# Patient Record
Sex: Female | Born: 1955
Health system: Southern US, Community
[De-identification: ages and names within clinical notes are randomized; demographics above are authoritative.]

## PROBLEM LIST (undated history)

## (undated) DIAGNOSIS — N390 Urinary tract infection, site not specified: Secondary | ICD-10-CM

## (undated) DIAGNOSIS — Z87898 Personal history of other specified conditions: Secondary | ICD-10-CM

## (undated) DIAGNOSIS — M1990 Unspecified osteoarthritis, unspecified site: Secondary | ICD-10-CM

## (undated) DIAGNOSIS — R51 Headache: Secondary | ICD-10-CM

## (undated) DIAGNOSIS — N63 Unspecified lump in unspecified breast: Secondary | ICD-10-CM

## (undated) DIAGNOSIS — E559 Vitamin D deficiency, unspecified: Secondary | ICD-10-CM

## (undated) DIAGNOSIS — F438 Other reactions to severe stress: Secondary | ICD-10-CM

## (undated) DIAGNOSIS — K219 Gastro-esophageal reflux disease without esophagitis: Secondary | ICD-10-CM

## (undated) DIAGNOSIS — M8580 Other specified disorders of bone density and structure, unspecified site: Secondary | ICD-10-CM

## (undated) DIAGNOSIS — C801 Malignant (primary) neoplasm, unspecified: Secondary | ICD-10-CM

## (undated) DIAGNOSIS — T7840XA Allergy, unspecified, initial encounter: Secondary | ICD-10-CM

## (undated) DIAGNOSIS — R05 Cough: Secondary | ICD-10-CM

## (undated) DIAGNOSIS — N6009 Solitary cyst of unspecified breast: Secondary | ICD-10-CM

## (undated) HISTORY — PX: UPPER GASTROINTESTINAL ENDOSCOPY: SHX188

## (undated) HISTORY — DX: Vitamin D deficiency, unspecified: E55.9

## (undated) HISTORY — PX: COLONOSCOPY: SHX174

## (undated) HISTORY — DX: Other reactions to severe stress: F43.8

## (undated) HISTORY — DX: Solitary cyst of unspecified breast: N60.09

## (undated) HISTORY — PX: BREAST CYST ASPIRATION: SHX578

## (undated) HISTORY — DX: Unspecified osteoarthritis, unspecified site: M19.90

## (undated) HISTORY — DX: Headache: R51

## (undated) HISTORY — DX: Cough: R05

## (undated) HISTORY — DX: Allergy, unspecified, initial encounter: T78.40XA

## (undated) HISTORY — DX: Unspecified lump in unspecified breast: N63.0

## (undated) HISTORY — DX: Other specified disorders of bone density and structure, unspecified site: M85.80

## (undated) HISTORY — PX: DILATION AND CURETTAGE OF UTERUS: SHX78

## (undated) HISTORY — DX: Personal history of other specified conditions: Z87.898

## (undated) HISTORY — PX: TUBAL LIGATION: SHX77

## (undated) HISTORY — DX: Urinary tract infection, site not specified: N39.0

## (undated) HISTORY — PX: BREAST ENHANCEMENT SURGERY: SHX7

## (undated) HISTORY — DX: Gastro-esophageal reflux disease without esophagitis: K21.9

## (undated) HISTORY — DX: Malignant (primary) neoplasm, unspecified: C80.1

## (undated) HISTORY — PX: TONSILLECTOMY: SUR1361

---

## 1998-08-21 ENCOUNTER — Other Ambulatory Visit: Admission: RE | Admit: 1998-08-21 | Discharge: 1998-08-21 | Payer: Self-pay | Admitting: Internal Medicine

## 1999-09-04 ENCOUNTER — Other Ambulatory Visit: Admission: RE | Admit: 1999-09-04 | Discharge: 1999-09-04 | Payer: Self-pay | Admitting: Internal Medicine

## 2001-02-02 ENCOUNTER — Other Ambulatory Visit: Admission: RE | Admit: 2001-02-02 | Discharge: 2001-02-02 | Payer: Self-pay | Admitting: Internal Medicine

## 2001-02-02 ENCOUNTER — Encounter: Payer: Self-pay | Admitting: Internal Medicine

## 2002-03-05 ENCOUNTER — Other Ambulatory Visit: Admission: RE | Admit: 2002-03-05 | Discharge: 2002-03-05 | Payer: Self-pay | Admitting: Internal Medicine

## 2003-08-05 ENCOUNTER — Other Ambulatory Visit: Admission: RE | Admit: 2003-08-05 | Discharge: 2003-08-05 | Payer: Self-pay | Admitting: Internal Medicine

## 2003-08-21 DIAGNOSIS — Z860100 Personal history of colon polyps, unspecified: Secondary | ICD-10-CM | POA: Insufficient documentation

## 2004-04-30 ENCOUNTER — Ambulatory Visit: Payer: Self-pay | Admitting: Internal Medicine

## 2004-06-05 ENCOUNTER — Ambulatory Visit: Payer: Self-pay | Admitting: Internal Medicine

## 2004-06-14 DIAGNOSIS — K219 Gastro-esophageal reflux disease without esophagitis: Secondary | ICD-10-CM

## 2004-06-14 HISTORY — DX: Gastro-esophageal reflux disease without esophagitis: K21.9

## 2004-09-14 ENCOUNTER — Other Ambulatory Visit: Admission: RE | Admit: 2004-09-14 | Discharge: 2004-09-14 | Payer: Self-pay | Admitting: Internal Medicine

## 2004-09-14 ENCOUNTER — Ambulatory Visit: Payer: Self-pay | Admitting: Internal Medicine

## 2005-05-03 ENCOUNTER — Inpatient Hospital Stay (HOSPITAL_COMMUNITY): Admission: EM | Admit: 2005-05-03 | Discharge: 2005-05-04 | Payer: Self-pay | Admitting: Emergency Medicine

## 2005-05-03 ENCOUNTER — Ambulatory Visit: Payer: Self-pay | Admitting: Internal Medicine

## 2005-05-04 ENCOUNTER — Ambulatory Visit: Payer: Self-pay | Admitting: Internal Medicine

## 2005-05-14 ENCOUNTER — Ambulatory Visit: Payer: Self-pay | Admitting: Internal Medicine

## 2005-07-16 ENCOUNTER — Ambulatory Visit: Payer: Self-pay | Admitting: Internal Medicine

## 2005-10-11 ENCOUNTER — Ambulatory Visit: Payer: Self-pay | Admitting: Internal Medicine

## 2005-10-11 ENCOUNTER — Other Ambulatory Visit: Admission: RE | Admit: 2005-10-11 | Discharge: 2005-10-11 | Payer: Self-pay | Admitting: Internal Medicine

## 2005-10-11 ENCOUNTER — Encounter: Payer: Self-pay | Admitting: Internal Medicine

## 2005-10-25 ENCOUNTER — Ambulatory Visit: Payer: Self-pay | Admitting: Internal Medicine

## 2006-03-03 ENCOUNTER — Ambulatory Visit: Payer: Self-pay | Admitting: Internal Medicine

## 2006-03-22 ENCOUNTER — Ambulatory Visit: Payer: Self-pay | Admitting: Internal Medicine

## 2006-05-09 ENCOUNTER — Ambulatory Visit: Payer: Self-pay | Admitting: Internal Medicine

## 2006-06-14 HISTORY — PX: PHOTOREFRACTIVE KERATOTOMY: SHX216

## 2006-09-12 ENCOUNTER — Ambulatory Visit: Payer: Self-pay | Admitting: Internal Medicine

## 2006-09-19 ENCOUNTER — Ambulatory Visit: Payer: Self-pay | Admitting: Internal Medicine

## 2007-01-25 ENCOUNTER — Telehealth: Payer: Self-pay | Admitting: Internal Medicine

## 2007-02-16 DIAGNOSIS — K219 Gastro-esophageal reflux disease without esophagitis: Secondary | ICD-10-CM | POA: Insufficient documentation

## 2007-03-07 ENCOUNTER — Ambulatory Visit: Payer: Self-pay | Admitting: Internal Medicine

## 2007-03-07 DIAGNOSIS — R5383 Other fatigue: Secondary | ICD-10-CM

## 2007-03-07 DIAGNOSIS — R5381 Other malaise: Secondary | ICD-10-CM | POA: Insufficient documentation

## 2007-03-07 DIAGNOSIS — G4700 Insomnia, unspecified: Secondary | ICD-10-CM | POA: Insufficient documentation

## 2007-03-09 LAB — CONVERTED CEMR LAB
Free T4: 0.8 ng/dL (ref 0.6–1.6)
T3, Free: 2.6 pg/mL (ref 2.3–4.2)
TSH: 1.17 microintl units/mL (ref 0.35–5.50)

## 2007-03-14 ENCOUNTER — Telehealth: Payer: Self-pay | Admitting: Internal Medicine

## 2007-03-14 LAB — CONVERTED CEMR LAB: Vit D, 1,25-Dihydroxy: 20 (ref 20–57)

## 2007-04-06 ENCOUNTER — Ambulatory Visit: Payer: Self-pay | Admitting: Internal Medicine

## 2007-04-25 ENCOUNTER — Telehealth: Payer: Self-pay | Admitting: Internal Medicine

## 2007-05-16 ENCOUNTER — Ambulatory Visit: Payer: Self-pay | Admitting: Internal Medicine

## 2007-05-16 ENCOUNTER — Encounter: Payer: Self-pay | Admitting: Internal Medicine

## 2007-05-16 ENCOUNTER — Other Ambulatory Visit: Admission: RE | Admit: 2007-05-16 | Discharge: 2007-05-16 | Payer: Self-pay | Admitting: Internal Medicine

## 2007-07-31 ENCOUNTER — Ambulatory Visit: Payer: Self-pay | Admitting: Internal Medicine

## 2007-07-31 ENCOUNTER — Telehealth: Payer: Self-pay | Admitting: Internal Medicine

## 2007-07-31 ENCOUNTER — Ambulatory Visit: Payer: Self-pay

## 2007-07-31 DIAGNOSIS — R609 Edema, unspecified: Secondary | ICD-10-CM

## 2007-07-31 DIAGNOSIS — R0609 Other forms of dyspnea: Secondary | ICD-10-CM

## 2007-07-31 DIAGNOSIS — M79609 Pain in unspecified limb: Secondary | ICD-10-CM

## 2007-07-31 DIAGNOSIS — R0989 Other specified symptoms and signs involving the circulatory and respiratory systems: Secondary | ICD-10-CM | POA: Insufficient documentation

## 2007-08-30 ENCOUNTER — Telehealth: Payer: Self-pay | Admitting: Internal Medicine

## 2007-09-21 ENCOUNTER — Encounter: Payer: Self-pay | Admitting: Internal Medicine

## 2007-10-13 ENCOUNTER — Telehealth: Payer: Self-pay | Admitting: Internal Medicine

## 2007-10-16 ENCOUNTER — Ambulatory Visit: Payer: Self-pay | Admitting: Internal Medicine

## 2007-10-16 DIAGNOSIS — R42 Dizziness and giddiness: Secondary | ICD-10-CM

## 2007-11-13 ENCOUNTER — Ambulatory Visit: Payer: Self-pay | Admitting: Internal Medicine

## 2007-11-13 DIAGNOSIS — E559 Vitamin D deficiency, unspecified: Secondary | ICD-10-CM

## 2007-11-13 DIAGNOSIS — L659 Nonscarring hair loss, unspecified: Secondary | ICD-10-CM | POA: Insufficient documentation

## 2007-11-13 HISTORY — DX: Vitamin D deficiency, unspecified: E55.9

## 2007-11-13 LAB — CONVERTED CEMR LAB: Vit D, 1,25-Dihydroxy: 45 (ref 30–89)

## 2007-11-19 LAB — CONVERTED CEMR LAB
ALT: 18 U/L
AST: 20 U/L
Albumin: 3.9 g/dL
Alkaline Phosphatase: 44 U/L
BUN: 11 mg/dL
Basophils Absolute: 0 K/uL
Basophils Relative: 0.5 %
Bilirubin, Direct: 0.1 mg/dL
CO2: 25 meq/L
Calcium: 9.4 mg/dL
Chloride: 102 meq/L
Creatinine, Ser: 0.8 mg/dL
Eosinophils Absolute: 0.1 K/uL
Eosinophils Relative: 1.3 %
Ferritin: 28 ng/mL
Free T4: 0.9 ng/dL
GFR calc Af Amer: 97 mL/min
GFR calc non Af Amer: 80 mL/min
Glucose, Bld: 72 mg/dL
HCT: 38.4 %
Hemoglobin: 13.2 g/dL
Lymphocytes Relative: 34.1 %
MCHC: 34.4 g/dL
MCV: 87.5 fL
Monocytes Absolute: 0.5 K/uL
Monocytes Relative: 6.5 %
Neutro Abs: 4.8 K/uL
Neutrophils Relative %: 57.6 %
Platelets: 255 K/uL
Potassium: 3.8 meq/L
RBC: 4.39 M/uL
RDW: 12.2 %
Sed Rate: 9 mm/h
Sodium: 138 meq/L
T3, Free: 2.7 pg/mL
TSH: 0.93 u[IU]/mL
Total Bilirubin: 0.7 mg/dL
Total Protein: 6.8 g/dL
WBC: 8.2 10*3/microliter

## 2007-12-20 ENCOUNTER — Telehealth: Payer: Self-pay | Admitting: *Deleted

## 2008-03-26 ENCOUNTER — Emergency Department (HOSPITAL_COMMUNITY): Admission: EM | Admit: 2008-03-26 | Discharge: 2008-03-26 | Payer: Self-pay | Admitting: Emergency Medicine

## 2008-04-01 ENCOUNTER — Ambulatory Visit: Payer: Self-pay | Admitting: Internal Medicine

## 2008-04-01 DIAGNOSIS — Z87898 Personal history of other specified conditions: Secondary | ICD-10-CM

## 2008-04-01 DIAGNOSIS — R51 Headache: Secondary | ICD-10-CM

## 2008-04-01 DIAGNOSIS — R519 Headache, unspecified: Secondary | ICD-10-CM | POA: Insufficient documentation

## 2008-04-01 HISTORY — DX: Personal history of other specified conditions: Z87.898

## 2008-04-05 ENCOUNTER — Encounter: Admission: RE | Admit: 2008-04-05 | Discharge: 2008-04-05 | Payer: Self-pay | Admitting: Internal Medicine

## 2008-04-10 ENCOUNTER — Telehealth: Payer: Self-pay | Admitting: *Deleted

## 2008-04-12 ENCOUNTER — Ambulatory Visit: Payer: Self-pay

## 2008-04-12 ENCOUNTER — Encounter: Payer: Self-pay | Admitting: Internal Medicine

## 2008-04-24 ENCOUNTER — Ambulatory Visit: Payer: Self-pay | Admitting: Internal Medicine

## 2008-04-24 DIAGNOSIS — R000 Tachycardia, unspecified: Secondary | ICD-10-CM

## 2008-04-24 DIAGNOSIS — F4389 Other reactions to severe stress: Secondary | ICD-10-CM | POA: Insufficient documentation

## 2008-04-24 DIAGNOSIS — F438 Other reactions to severe stress: Secondary | ICD-10-CM

## 2008-04-24 HISTORY — DX: Other reactions to severe stress: F43.89

## 2008-04-24 HISTORY — DX: Other reactions to severe stress: F43.8

## 2008-05-01 ENCOUNTER — Telehealth: Payer: Self-pay | Admitting: Internal Medicine

## 2008-05-07 ENCOUNTER — Telehealth: Payer: Self-pay | Admitting: *Deleted

## 2008-05-15 ENCOUNTER — Ambulatory Visit: Payer: Self-pay

## 2008-05-15 ENCOUNTER — Encounter: Payer: Self-pay | Admitting: Internal Medicine

## 2008-05-23 ENCOUNTER — Ambulatory Visit: Payer: Self-pay | Admitting: Internal Medicine

## 2008-09-10 ENCOUNTER — Telehealth: Payer: Self-pay | Admitting: Internal Medicine

## 2008-10-09 ENCOUNTER — Encounter: Payer: Self-pay | Admitting: Internal Medicine

## 2009-01-13 ENCOUNTER — Ambulatory Visit: Payer: Self-pay | Admitting: Internal Medicine

## 2009-01-13 DIAGNOSIS — J019 Acute sinusitis, unspecified: Secondary | ICD-10-CM | POA: Insufficient documentation

## 2009-01-13 DIAGNOSIS — G479 Sleep disorder, unspecified: Secondary | ICD-10-CM

## 2009-02-19 IMAGING — CT CT HEAD W/O CM
1 of 2 series · 13 of 30 positions shown, 17 images · non-contrast
Comparison: No prior

CLINICAL DATA: Headache/blurred vision

CT HEAD WITHOUT CONTRAST
TECHNIQUE: Contiguous axial images were obtained from the base of
the skull through the vertex without contrast.

[Series 2: brain · axial · 0.47mm/px · z∈[+122,+242]mm · 13 of 28 slices shown, 17 images]
[im 2/28  brain]
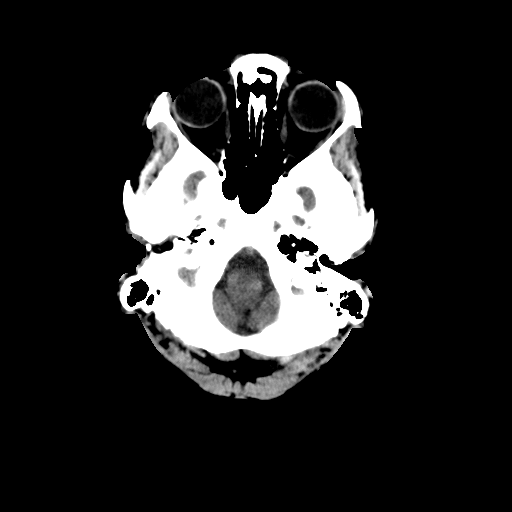
[im 2/28  bone]
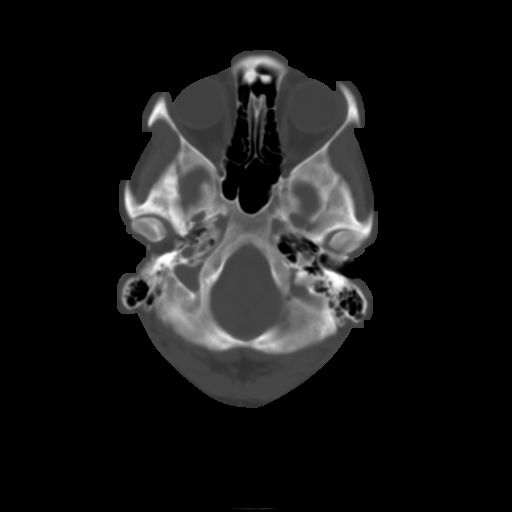
[im 4/28  brain]
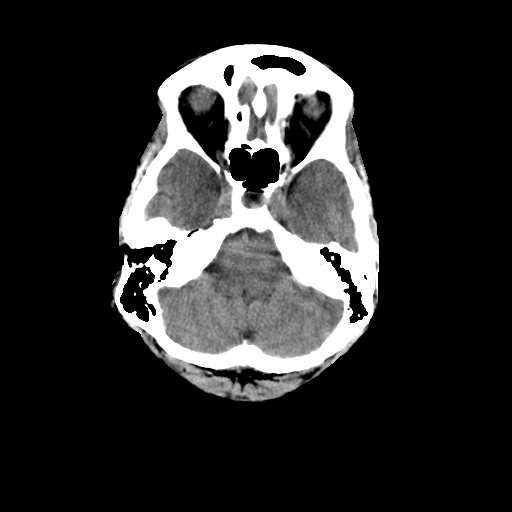
[im 6/28  brain]
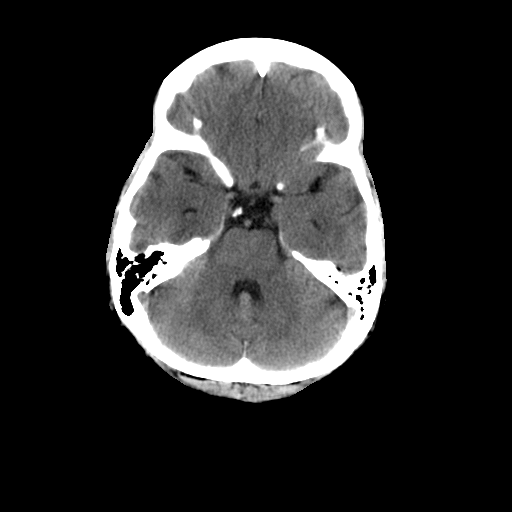
[im 8/28  brain]
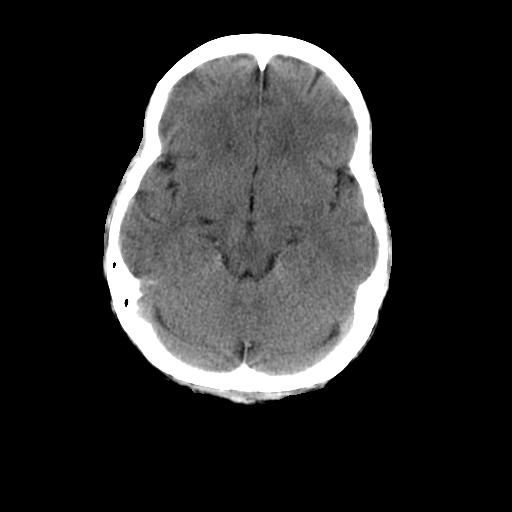
[im 10/28  brain]
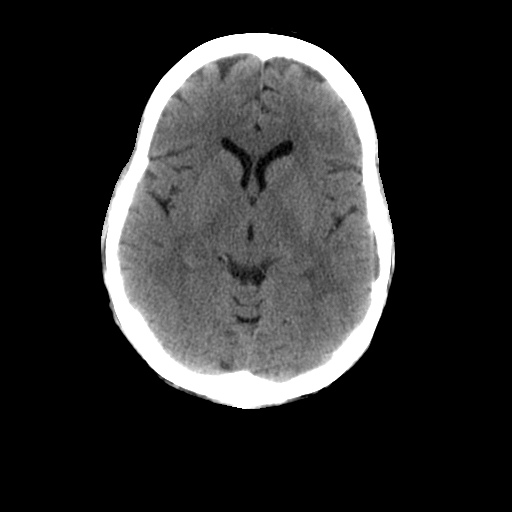
[im 10/28  bone]
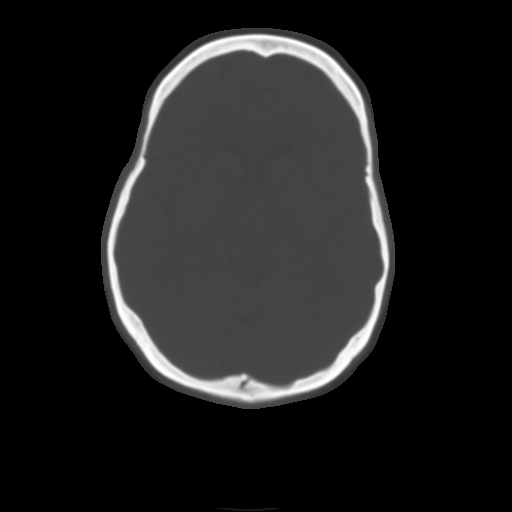
[im 12/28  brain]
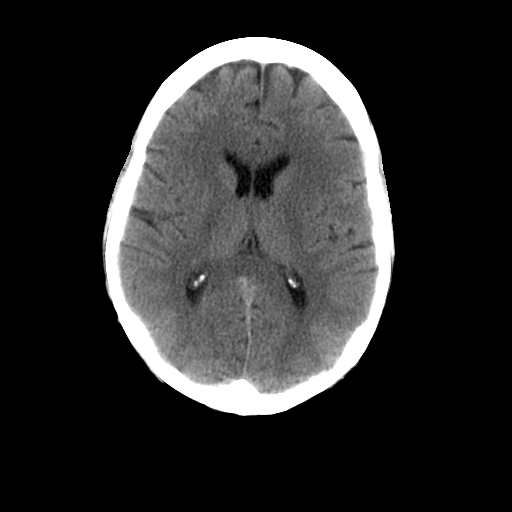
[im 14/28  brain]
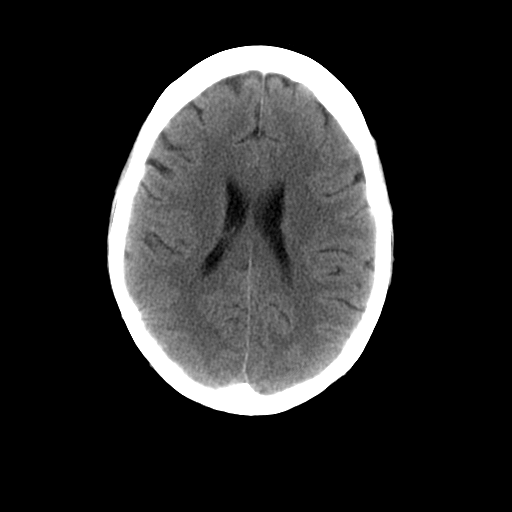
[im 16/28  brain]
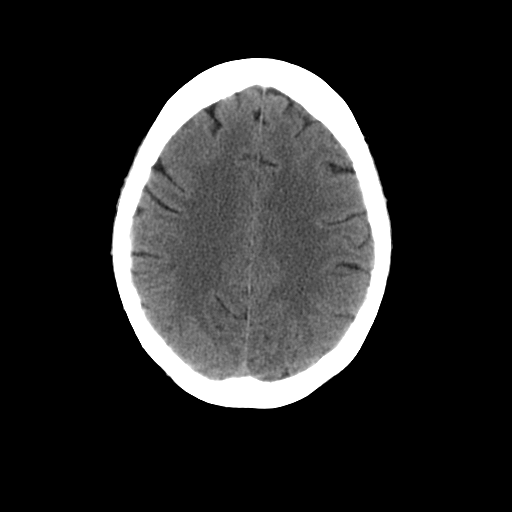
[im 18/28  brain]
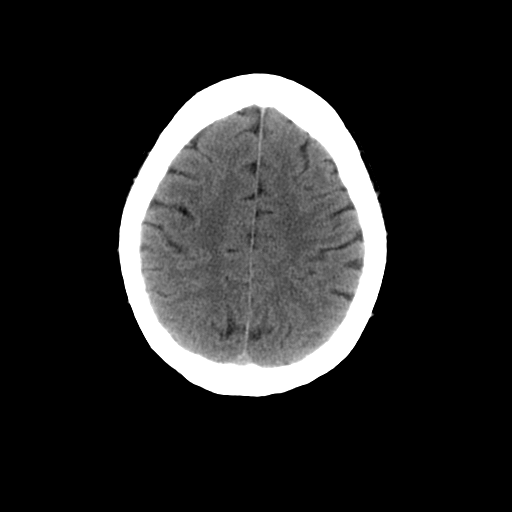
[im 18/28  bone]
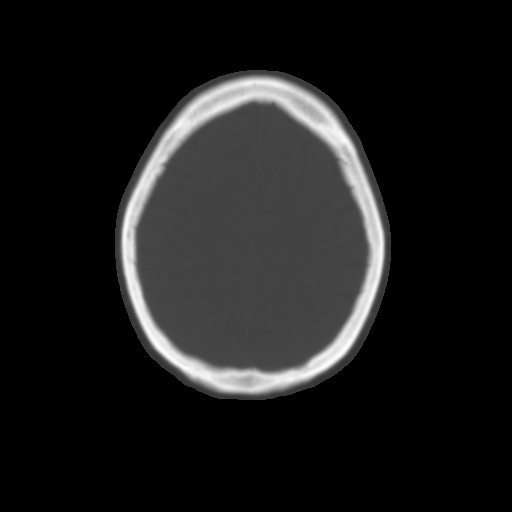
[im 20/28  brain]
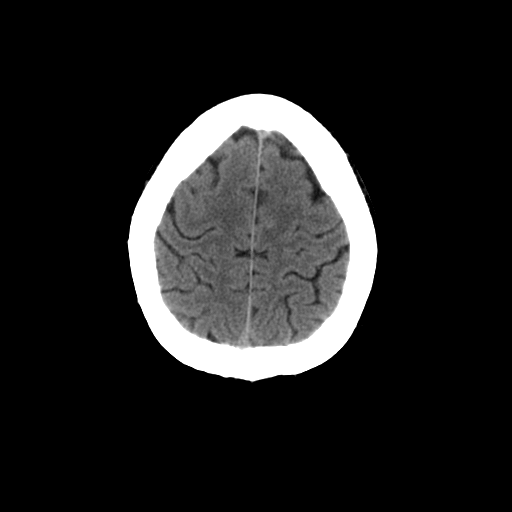
[im 22/28  brain]
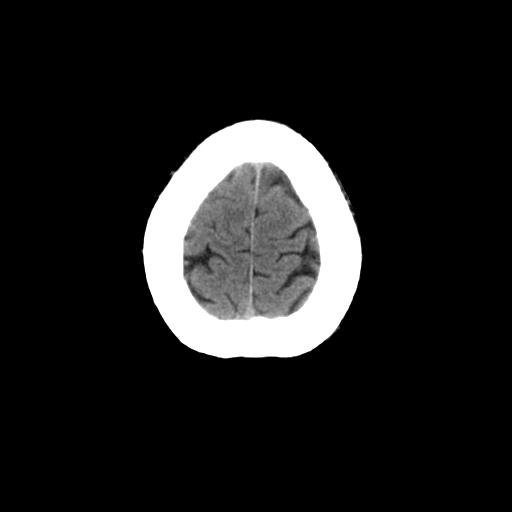
[im 24/28  brain]
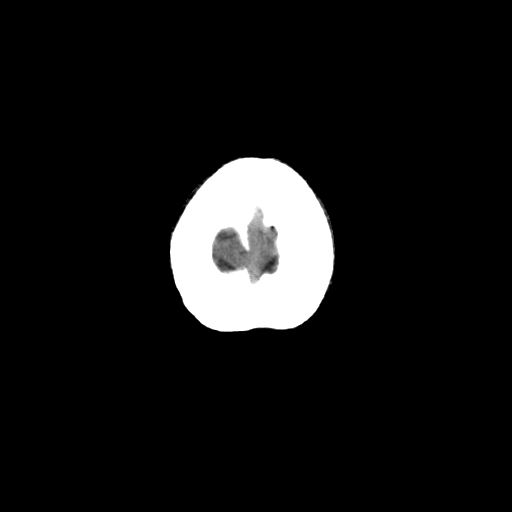
[im 26/28  brain]
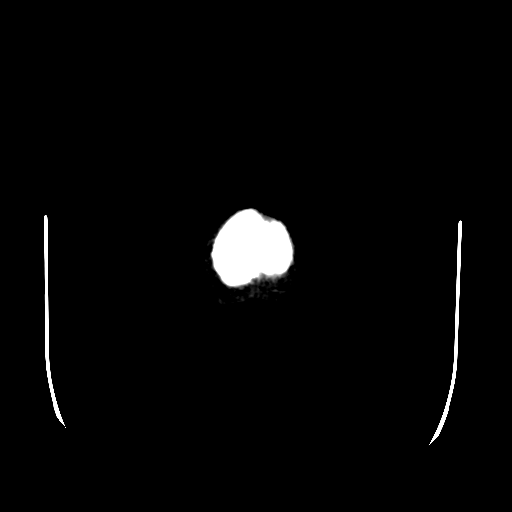
[im 26/28  bone]
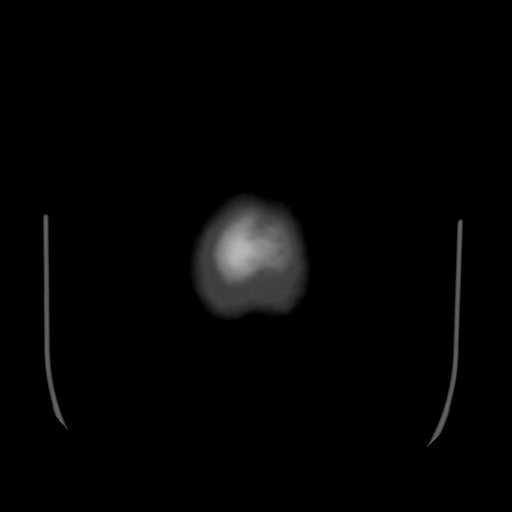

[13 of 30 positions shown; findings below may reference images not displayed]

FINDINGS: [Ventricular size and CSF spaces within normal limits.
No evidence for acute infarct or bleed.  No mass effect. Calvarium
intact.  No fluid in the sinuses visualized.
IMPRESSION: No acute or significant findings.

## 2009-02-19 IMAGING — CR DG CHEST 2V
2 series · 2 of 2 positions shown · non-contrast
Comparison: 05/02/2005

CLINICAL DATA: Chest pain

CHEST - 2 VIEW

[w chest pa]
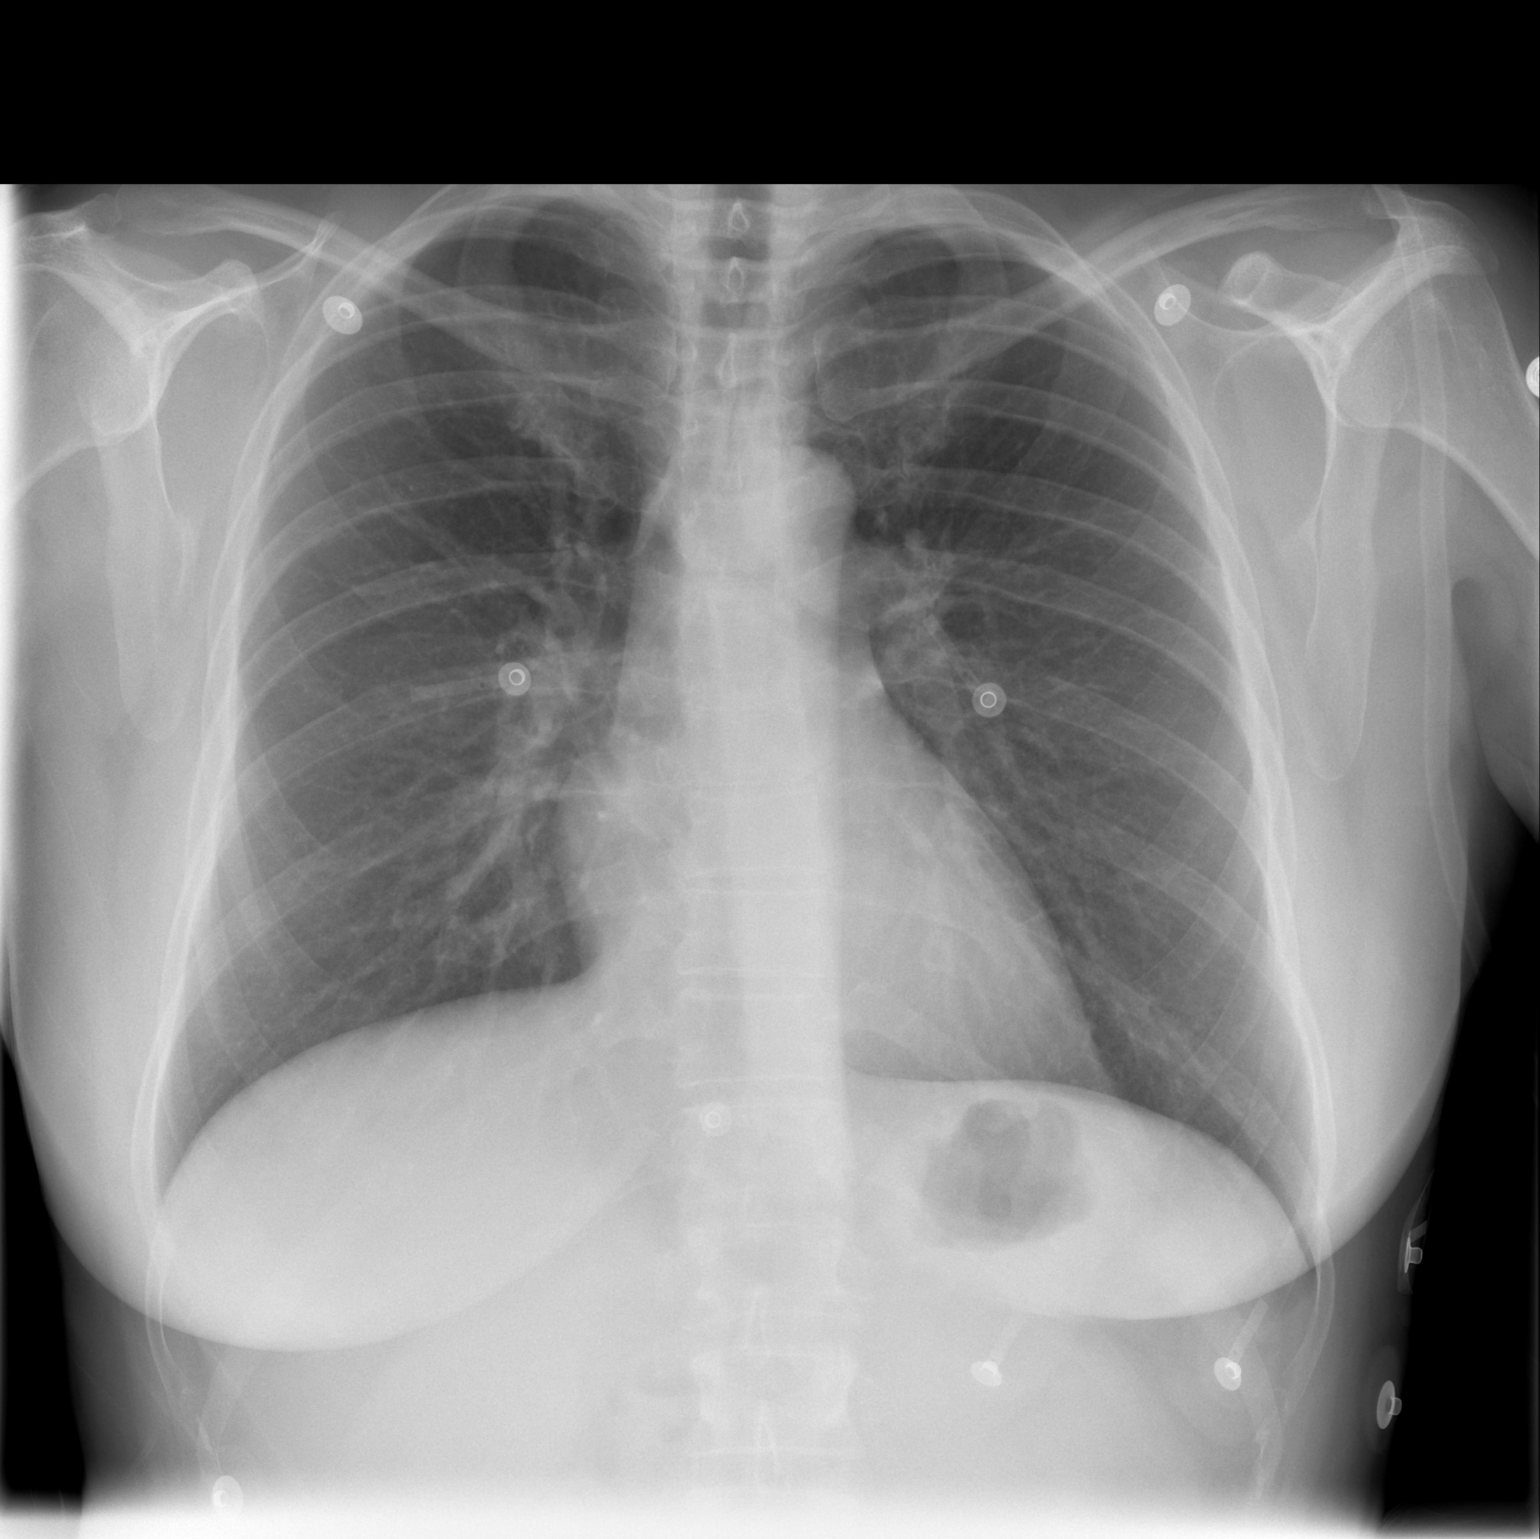

[w chest lat]
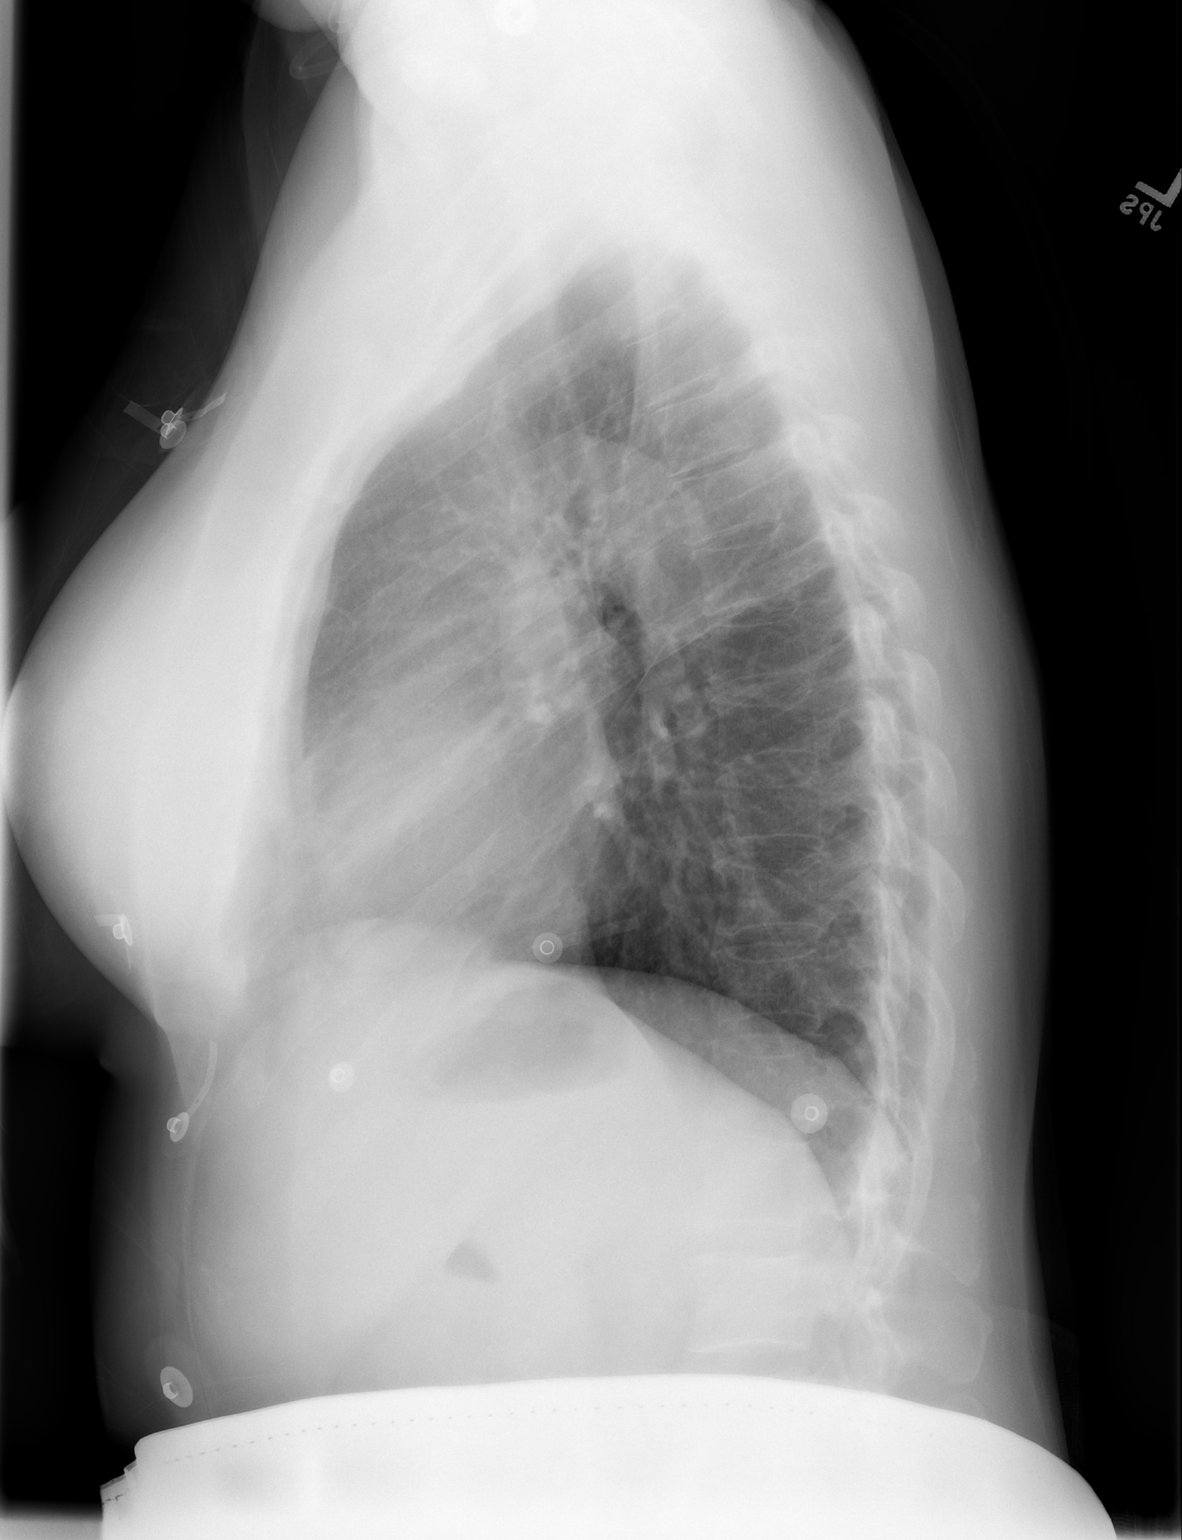

[2 of 2 positions shown; findings below may reference images not displayed]

FINDINGS: The abdomen and pelvis were shielded.

Heart and mediastinal contours normal.  Lungs clear.  Osseous
structures intact.  No abnormality of the soft tissues.
IMPRESSION: No active disease.

## 2009-03-31 ENCOUNTER — Encounter: Payer: Self-pay | Admitting: Internal Medicine

## 2009-04-29 ENCOUNTER — Other Ambulatory Visit: Admission: RE | Admit: 2009-04-29 | Discharge: 2009-04-29 | Payer: Self-pay | Admitting: Internal Medicine

## 2009-04-29 ENCOUNTER — Ambulatory Visit: Payer: Self-pay | Admitting: Internal Medicine

## 2009-04-29 ENCOUNTER — Encounter: Payer: Self-pay | Admitting: Internal Medicine

## 2009-04-29 DIAGNOSIS — J329 Chronic sinusitis, unspecified: Secondary | ICD-10-CM | POA: Insufficient documentation

## 2009-07-01 ENCOUNTER — Telehealth: Payer: Self-pay | Admitting: *Deleted

## 2009-09-15 ENCOUNTER — Telehealth: Payer: Self-pay | Admitting: *Deleted

## 2009-10-16 ENCOUNTER — Encounter: Payer: Self-pay | Admitting: Internal Medicine

## 2009-10-21 ENCOUNTER — Encounter: Payer: Self-pay | Admitting: Internal Medicine

## 2009-10-22 ENCOUNTER — Encounter: Payer: Self-pay | Admitting: Internal Medicine

## 2009-12-19 ENCOUNTER — Telehealth: Payer: Self-pay | Admitting: Internal Medicine

## 2010-01-28 ENCOUNTER — Ambulatory Visit: Payer: Self-pay | Admitting: Family Medicine

## 2010-01-28 DIAGNOSIS — N63 Unspecified lump in unspecified breast: Secondary | ICD-10-CM | POA: Insufficient documentation

## 2010-01-28 HISTORY — DX: Unspecified lump in unspecified breast: N63.0

## 2010-02-04 ENCOUNTER — Encounter: Payer: Self-pay | Admitting: Internal Medicine

## 2010-02-05 ENCOUNTER — Encounter: Payer: Self-pay | Admitting: Internal Medicine

## 2010-02-09 ENCOUNTER — Other Ambulatory Visit: Admission: RE | Admit: 2010-02-09 | Discharge: 2010-02-09 | Payer: Self-pay | Admitting: Radiology

## 2010-02-09 ENCOUNTER — Encounter: Payer: Self-pay | Admitting: Internal Medicine

## 2010-02-11 ENCOUNTER — Encounter: Payer: Self-pay | Admitting: Internal Medicine

## 2010-03-15 ENCOUNTER — Encounter: Payer: Self-pay | Admitting: Internal Medicine

## 2010-03-19 ENCOUNTER — Telehealth: Payer: Self-pay | Admitting: *Deleted

## 2010-03-24 ENCOUNTER — Telehealth: Payer: Self-pay | Admitting: Internal Medicine

## 2010-04-30 ENCOUNTER — Ambulatory Visit: Payer: Self-pay | Admitting: Internal Medicine

## 2010-04-30 ENCOUNTER — Other Ambulatory Visit: Admission: RE | Admit: 2010-04-30 | Discharge: 2010-04-30 | Payer: Self-pay | Admitting: Internal Medicine

## 2010-04-30 LAB — CONVERTED CEMR LAB
Bilirubin Urine: NEGATIVE
Glucose, Urine, Semiquant: NEGATIVE
Ketones, urine, test strip: NEGATIVE
Nitrite: NEGATIVE
Protein, U semiquant: NEGATIVE
Specific Gravity, Urine: 1.02
Urobilinogen, UA: 0.2
WBC Urine, dipstick: NEGATIVE
pH: 5.5

## 2010-05-01 ENCOUNTER — Encounter: Payer: Self-pay | Admitting: *Deleted

## 2010-05-01 LAB — CONVERTED CEMR LAB
ALT: 18 units/L (ref 0–35)
AST: 18 units/L (ref 0–37)
Albumin: 4.3 g/dL (ref 3.5–5.2)
Alkaline Phosphatase: 56 units/L (ref 39–117)
BUN: 16 mg/dL (ref 6–23)
Basophils Absolute: 0 10*3/uL (ref 0.0–0.1)
Basophils Relative: 0.5 % (ref 0.0–3.0)
Bilirubin, Direct: 0.1 mg/dL (ref 0.0–0.3)
CO2: 28 meq/L (ref 19–32)
Calcium: 9.2 mg/dL (ref 8.4–10.5)
Chloride: 100 meq/L (ref 96–112)
Cholesterol: 237 mg/dL — ABNORMAL HIGH (ref 0–200)
Creatinine, Ser: 0.9 mg/dL (ref 0.4–1.2)
Direct LDL: 162.3 mg/dL
Eosinophils Absolute: 0.1 10*3/uL (ref 0.0–0.7)
Eosinophils Relative: 0.9 % (ref 0.0–5.0)
GFR calc non Af Amer: 70.01 mL/min (ref >60)
Glucose, Bld: 84 mg/dL (ref 70–99)
HCT: 40.2 % (ref 36.0–46.0)
HDL: 57.3 mg/dL (ref >39.00)
Hemoglobin: 13.7 g/dL (ref 12.0–15.0)
Lymphocytes Relative: 44 % (ref 12.0–46.0)
Lymphs Abs: 3.2 10*3/uL (ref 0.7–4.0)
MCHC: 34 g/dL (ref 30.0–36.0)
MCV: 86.9 fL (ref 78.0–100.0)
Monocytes Absolute: 0.5 10*3/uL (ref 0.1–1.0)
Monocytes Relative: 6.4 % (ref 3.0–12.0)
Neutro Abs: 3.5 10*3/uL (ref 1.4–7.7)
Neutrophils Relative %: 48.2 % (ref 43.0–77.0)
Platelets: 256 10*3/uL (ref 150.0–400.0)
Potassium: 3.8 meq/L (ref 3.5–5.1)
RBC: 4.63 M/uL (ref 3.87–5.11)
RDW: 13.1 % (ref 11.5–14.6)
Sodium: 138 meq/L (ref 135–145)
TSH: 1.03 microintl units/mL (ref 0.35–5.50)
Total Bilirubin: 0.6 mg/dL (ref 0.3–1.2)
Total CHOL/HDL Ratio: 4
Total Protein: 6.9 g/dL (ref 6.0–8.3)
Triglycerides: 85 mg/dL (ref 0.0–149.0)
VLDL: 17 mg/dL (ref 0.0–40.0)
WBC: 7.3 10*3/uL (ref 4.5–10.5)

## 2010-05-04 LAB — CONVERTED CEMR LAB: Pap Smear: NEGATIVE

## 2010-06-10 ENCOUNTER — Telehealth: Payer: Self-pay | Admitting: *Deleted

## 2010-07-12 LAB — CONVERTED CEMR LAB
ALT: 14 units/L (ref 0–35)
AST: 14 units/L (ref 0–37)
Albumin: 4.2 g/dL (ref 3.5–5.2)
Alkaline Phosphatase: 52 units/L (ref 39–117)
BUN: 11 mg/dL (ref 6–23)
Basophils Absolute: 0 10*3/uL (ref 0.0–0.1)
Basophils Relative: 0.5 % (ref 0.0–3.0)
Bilirubin Urine: NEGATIVE
Bilirubin, Direct: 0 mg/dL (ref 0.0–0.3)
Blood in Urine, dipstick: NEGATIVE
CO2: 29 meq/L (ref 19–32)
Calcium: 9.3 mg/dL (ref 8.4–10.5)
Chloride: 103 meq/L (ref 96–112)
Cholesterol: 195 mg/dL (ref 0–200)
Creatinine, Ser: 0.9 mg/dL (ref 0.4–1.2)
Eosinophils Absolute: 0 10*3/uL (ref 0.0–0.7)
Eosinophils Relative: 0.5 % (ref 0.0–5.0)
GFR calc non Af Amer: 69.37 mL/min (ref >60)
Glucose, Bld: 91 mg/dL (ref 70–99)
Glucose, Urine, Semiquant: NEGATIVE
HCT: 41.9 % (ref 36.0–46.0)
HDL: 54.3 mg/dL (ref >39.00)
Hemoglobin: 14.1 g/dL (ref 12.0–15.0)
Ketones, urine, test strip: NEGATIVE
LDL Cholesterol: 120 mg/dL — ABNORMAL HIGH (ref 0–99)
Lymphocytes Relative: 36.6 % (ref 12.0–46.0)
Lymphs Abs: 3.2 10*3/uL (ref 0.7–4.0)
MCHC: 33.7 g/dL (ref 30.0–36.0)
MCV: 90.6 fL (ref 78.0–100.0)
Monocytes Absolute: 0.7 10*3/uL (ref 0.1–1.0)
Monocytes Relative: 7.6 % (ref 3.0–12.0)
Neutro Abs: 4.9 10*3/uL (ref 1.4–7.7)
Neutrophils Relative %: 54.8 % (ref 43.0–77.0)
Nitrite: NEGATIVE
Pap Smear: NORMAL
Platelets: 251 10*3/uL (ref 150.0–400.0)
Potassium: 4 meq/L (ref 3.5–5.1)
Protein, U semiquant: NEGATIVE
RBC: 4.63 M/uL (ref 3.87–5.11)
RDW: 12.3 % (ref 11.5–14.6)
Sodium: 141 meq/L (ref 135–145)
Specific Gravity, Urine: 1.02
TSH: 1.62 microintl units/mL (ref 0.35–5.50)
Total Bilirubin: 0.7 mg/dL (ref 0.3–1.2)
Total CHOL/HDL Ratio: 4
Total Protein: 6.9 g/dL (ref 6.0–8.3)
Triglycerides: 105 mg/dL (ref 0.0–149.0)
Urobilinogen, UA: 0.2
VLDL: 21 mg/dL (ref 0.0–40.0)
WBC Urine, dipstick: NEGATIVE
WBC: 8.8 10*3/uL (ref 4.5–10.5)
pH: 6.5

## 2010-07-16 NOTE — Assessment & Plan Note (Signed)
Summary: breast pain/njr   Vital Signs:  Patient profile:   55 year old female Menstrual status:  regular Height:      61 inches (154.94 cm) Weight:      128 pounds (58.18 kg) BMI:     24.27 O2 Sat:      98 % on Room air Temp:     98.0 degrees F (36.67 degrees C) oral Pulse rate:   99 / minute BP sitting:   106 / 82  (left arm) Cuff size:   regular  Vitals Entered By: Josph Macho RMA (January 28, 2010 8:14 AM)  O2 Flow:  Room air CC: Right breast pain/ CF Is Patient Diabetic? No   History of Present Illness: Patient in today to have some pains in her right breast evaluated. In April of this year she had a MGM which showed a suspicious spot in her right breast but in further evaluation with [place] the lesion was evaluated as benign and a repeat MGM was ordered for November. About 4-6 weeks ago she started noting some shooting pains in the right breast around the lesion. She also thinks the lesion is getting bigger. She does note a h/o breast implants years ago. Menarche was around 2. Last normal menses was in 12/10, then she spotted once in March of this year and again last month. She drinks 1 Chai tea daily but no other caffeine. Review of FM reveals a maternal Aunt who had breast CA at an unknown age but died of other causes around 39 and 2 maternal cousins with breast cancer both diagnosed in their 26s. No h/o cigarette use and she did nurse her 2 children, at 55 yo she nursed for 56 months and at 55 she nursed for 56 more months. No other symptoms, her sinusitis is resolved. She denies any f/c/malaise/myalgias/fatigue/anorexia/CP/palp/SOB/GI c/o.  Current Medications (verified): 1)  Protonix 40 Mg  Tbec (Pantoprazole Sodium) .Marland Kitchen.. 1 By Mouth Once Daily 2)  Ambien 10 Mg  Tabs (Zolpidem Tartrate) .Marland Kitchen.. 1 By Mouth At Bedtime  Allergies (verified): No Known Drug Allergies  Past History:  Past medical history reviewed for relevance to current acute and chronic problems. Social history  (including risk factors) reviewed for relevance to current acute and chronic problems.  Past Medical History: Reviewed history from 04/29/2009 and no changes required. GERD with esophagitis and GI bleed  2006 Heart Murmur UTI cbx 2    tubal pregnancy 1993     Social History: Reviewed history from 04/29/2009 and no changes required. Occupation: Married Current Smoker cigars Alcohol use-yes Drug use-no Regular exercise-no    HH of 4 plus baby expectant  pets cat and dog.   Sleep      Review of Systems      See HPI  Physical Exam  General:  Well-developed,well-nourished,in no acute distress; alert,appropriate and cooperative throughout examination Head:  Normocephalic and atraumatic without obvious abnormalities. No apparent alopecia or balding. Ears:  External ear exam shows no significant lesions or deformities.  Otoscopic examination reveals clear canals, tympanic membranes are intact bilaterally without bulging, retraction, inflammation or discharge. Hearing is grossly normal bilaterally. Neck:  No deformities, masses, or tenderness noted. Breasts:  lesion at roughly 10 oclock in left breast is ill defined but mobile and mildly tender roughly 3-4 cm in diameter. 1-2 cm firm/NT/mobile lesion noted in Subcutaneously tissue at 2 oclock. No nipple discharge, skin changes or other palpable nodules, scars c/w breast augmentation Lungs:  Normal respiratory effort, chest expands symmetrically.  Lungs are clear to auscultation, no crackles or wheezes. Heart:  Normal rate and regular rhythm. S1 and S2 normal without gallop, murmur, click, rub or other extra sounds. Abdomen:  Bowel sounds positive,abdomen soft and non-tender without masses, organomegaly or hernias noted. Extremities:  No clubbing, cyanosis, edema, or deformity noted  Psych:  Cognition and judgment appear intact. Alert and cooperative with normal attention span and concentration. No apparent delusions, illusions,  hallucinations;slightly anxious.     Impression & Recommendations:  Problem # 1:  BREAST MASS (ICD-611.72) b/l lesions noted. Patient came in due to shooting pains in right breast, a roughly 3-4 cm lesion noted at 10 oclock on right, incidental firm nodule felt in Subcutaneousl tissue noted on right at 2 oclock Orders: Radiology Referral (Radiology): diagnostic b/l MGM and [place] as indicated ordered at [facility] where she has had previous MGMs  Problem # 2:  SINUSITIS, RECURRENT (ICD-473.9)  The following medications were removed from the medication list:    Augmentin 875-125 Mg Tabs (Amoxicillin-pot clavulanate) ..... Seen at urgent care    Fluticasone Propionate 50 Mcg/act Susp (Fluticasone propionate) .Marland Kitchen... 2 sprays each nares q d  for recurrent sinusitis Recent illness has resolved, she denies any concerning symptoms  Complete Medication List: 1)  Protonix 40 Mg Tbec (Pantoprazole sodium) .Marland Kitchen.. 1 by mouth once daily 2)  Ambien 10 Mg Tabs (Zolpidem tartrate) .Marland Kitchen.. 1 by mouth at bedtime  Patient Instructions: 1)  Please schedule a follow-up appointment as needed if symtoms worsen or any concerns. 2)  You will hear from our referral desk about appt time

## 2010-07-16 NOTE — Progress Notes (Signed)
Summary: refill on fluticasone  Phone Note From Pharmacy   Caller: [facility] Reason for Call: Needs renewal Details for Reason: fluticasone  Summary of Call: Pt needs to schedule a cpx Initial call taken by: Romualdo Bolk, CMA Duncan Dull),  March 19, 2010 11:30 AM  Follow-up for Phone Call        LMTOCB- Rx sent to pharmacy Follow-up by: Romualdo Bolk, CMA Duncan Dull),  March 19, 2010 1:48 PM  Additional Follow-up for Phone Call Additional follow up Details #1::        Pt aware of this and will call back to schedule a cpx in 04/2010. Pt also states that she doesn't use the flonase daily. So she is going to call us when she needs a refill. Additional Follow-up by: Romualdo Bolk, CMA (AAMA),  March 24, 2010 8:32 AM    New/Updated Medications: FLUTICASONE PROPIONATE 50 MCG/ACT SUSP (FLUTICASONE PROPIONATE) use 2 sprays in each nostril daily for recurrent sinusitis FLUTICASONE PROPIONATE 50 MCG/ACT SUSP (FLUTICASONE PROPIONATE) use 2 sprays in each nostril daily for recurrent sinusitis Prescriptions: FLUTICASONE PROPIONATE 50 MCG/ACT SUSP (FLUTICASONE PROPIONATE) use 2 sprays in each nostril daily for recurrent sinusitis  #3 x 0   Entered by:   Romualdo Bolk, CMA (AAMA)   Authorized by:   Madelin Headings MD   Signed by:   Romualdo Bolk, CMA (AAMA) on 03/19/2010   Method used:   Faxed to ...       [facility]order)       [address] [place], [place]  [address]       Ph: [phone]       Fax: [phone]   RxID:   [id number]

## 2010-07-16 NOTE — Progress Notes (Signed)
Summary: refill  Phone Note From Pharmacy   Caller: [facility] Reason for Call: Needs renewal Summary of Call: Protonix 40mg  Initial call taken by: Romualdo Bolk, CMA Duncan Dull),  June 10, 2010 10:23 AM  Follow-up for Phone Call        Rx sent to pharmacy Follow-up by: Romualdo Bolk, CMA Duncan Dull),  June 10, 2010 10:23 AM    Prescriptions: PROTONIX 40 MG  TBEC (PANTOPRAZOLE SODIUM) 1 by mouth once daily  #90 x 3   Entered by:   Romualdo Bolk, CMA (AAMA)   Authorized by:   Madelin Headings MD   Signed by:   Romualdo Bolk, CMA (AAMA) on 06/10/2010   Method used:   Faxed to ...       [facility] (mail[facility]order)       [address] [place], [place]  [address]       Ph: [phone]       Fax: [phone]   RxID:   [id number]

## 2010-07-16 NOTE — Progress Notes (Signed)
Summary: Pt req cpx in Nov. Has to be after 04/29/10/  Phone Note Call from Patient Call back at Work Phone [phone]   Caller: Patient Summary of Call: Pt called and is req to get in for cpx in November. Has to be after 04/29/10. Pls advise.  Initial call taken by: Lucy Antigua,  March 24, 2010 8:47 AM  Follow-up for Phone Call        Can do 12/6 at 1:45pm and block the 2pm Follow-up by: Romualdo Bolk, CMA Duncan Dull),  March 24, 2010 10:26 AM  Additional Follow-up for Phone Call Additional follow up Details #1::        Pt says that she can't do anything in Dec. Pt says that it has to be in Nov. after 04/29/10.  Additional Follow-up by: Lucy Antigua,  March 24, 2010 11:54 AM    Additional Follow-up for Phone Call Additional follow up Details #2::    Can we use Thur 11/17 and take 2 slots? Romualdo Bolk, CMA Duncan Dull)  March 24, 2010 2:03 PM   Additional Follow-up for Phone Call Additional follow up Details #3:: Details for Additional Follow-up Action Taken: ok to do this put in am  Additional Follow-up by: Madelin Headings MD,  March 25, 2010 9:49 AM

## 2010-07-16 NOTE — Progress Notes (Signed)
Summary: need 2 refills of Zolpidem Tartrate  Phone Note Refill Request Call back at [phone] Message from:  spouse---live call  Refills Requested: Medication #1:  AMBIEN 10 MG  TABS 1 by mouth at bedtime   Brand Name Necessary? No send to [facility].     please also call in a temporary of 5 days worth [facility]. please call when done.  Initial call taken by: Warnell Forester,  December 19, 2009 12:59 PM  Follow-up for Phone Call        husband callback checking on status Follow-up by: Heron Sabins,  December 19, 2009 4:39 PM  Additional Follow-up for Phone Call Additional follow up Details #1::        ok to do  above Additional Follow-up by: Madelin Headings MD,  December 19, 2009 4:59 PM    Additional Follow-up for Phone Call Additional follow up Details #2::    called pharm - gave rx for #5 SHORT TERM AND SENT FULL RX TO [facility]. attempt to call cell # left - would not let me leave message-called home # and left message.KIK Follow-up by: Duard Brady LPN,  December 19, 6293 5:08 PM  Prescriptions: AMBIEN 10 MG  TABS (ZOLPIDEM TARTRATE) 1 by mouth at bedtime  #90 x 1   Entered by:   Duard Brady LPN   Authorized by:   Madelin Headings MD   Signed by:   Duard Brady LPN on 28/41/3244   Method used:   Printed then faxed to ...       [facility]order[facility]       [address] [place], [place]  [address]       Ph: [phone]       Fax: [phone]   RxID:   [id number]

## 2010-07-16 NOTE — Op Note (Signed)
Summary: Cyst Aspiration, Left Breast/Addendum  Cyst Aspiration, Left Breast/Addendum   Imported By: Maryln Gottron 02/24/2010 11:09:41  _____________________________________________________________________  External Attachment:    Type:   Image     Comment:   External Document

## 2010-07-16 NOTE — Progress Notes (Signed)
Summary: refill  Phone Note From Pharmacy   Caller: [facility] Reason for Call: Needs renewal Details for Reason: Protonix 40mg  Initial call taken by: Romualdo Bolk, CMA Duncan Dull),  September 15, 2009 2:42 PM  Follow-up for Phone Call        Rx sent to pharmacy. Follow-up by: Romualdo Bolk, CMA (AAMA),  September 15, 2009 2:42 PM    Prescriptions: PROTONIX 40 MG  TBEC (PANTOPRAZOLE SODIUM) 1 by mouth once daily  #90 x 2   Entered by:   Romualdo Bolk, CMA (AAMA)   Authorized by:   Madelin Headings MD   Signed by:   Romualdo Bolk, CMA (AAMA) on 09/15/2009   Method used:   Faxed to ...       [facility] (mail-order)       [address] [place], [place]  [address]       Ph: [phone]       Fax: [phone]   RxID:   [id number]

## 2010-07-16 NOTE — Miscellaneous (Signed)
Summary: Flu Shot/[facility]  Flu Shot/[facility]   Imported By: Maryln Gottron 03/24/2010 15:26:48  _____________________________________________________________________  External Attachment:    Type:   Image     Comment:   External Document

## 2010-07-16 NOTE — Assessment & Plan Note (Signed)
Summary: CPX/SSC   Vital Signs:  Patient profile:   55 year old female Menstrual status:  postmenopausal Height:      60.5 inches Weight:      129 pounds BMI:     24.87 Pulse rate:   78 / minute BP sitting:   120 / 80  (right arm) Cuff size:   regular  Vitals Entered By: Romualdo Bolk, CMA (AAMA) (April 30, 2010 11:20 AM) CC: CPX with pap LMP - Character: normal Menarche (age onset years): 11.5   Menses interval (days): 28 Menstrual flow (days): 3-4 Menstrual Status postmenopausal Last PAP Result NEGATIVE FOR INTRAEPITHELIAL LESIONS OR MALIGNANCY.   History of Present Illness: Marcia Irwin comes in today  for preventive visit .  Generally  doing  well. Since last visit  here  there have been no major changes in health status  .  However aftet last plain flight noticed again left face pain pressure like early sinusitis she has had before  Clogged on left  and sudafed salin modestly helping, Some on left side congestion and pressure . No fever    flying a lot . Sleep  still using med without problem   needs refill  GERD: raking meds about 2-3 x per week    helping  Had breast cysts aspirated .  following Peripost menopausal  Preventive Care Screening  Mammogram:    Date:  02/09/2010    Results:  cyst(s) bilateral   Prior Values:    Pap Smear:  NEGATIVE FOR INTRAEPITHELIAL LESIONS OR MALIGNANCY. (04/29/2009)    Mammogram:  normal (10/09/2008)    Colonoscopy:  normal (09/19/2006)    Last Tetanus Booster:  Historical (06/14/1996)   Preventive Screening-Counseling & Management  Alcohol-Tobacco     Alcohol drinks/day: <1     Alcohol type: wine     Smoking Status: quit     Cigars/week: <1  Caffeine-Diet-Exercise     Caffeine use/day: 0     Does Patient Exercise: no  Hep-HIV-STD-Contraception     Dental Visit-last 6 months yes     Sun Exposure-Excessive: no  Safety-Violence-Falls     Seat Belt Use: yes     Firearms in the Home: no firearms in the  home     Smoke Detectors: yes  Current Medications (verified): 1)  Protonix 40 Mg  Tbec (Pantoprazole Sodium) .Marland Kitchen.. 1 By Mouth Once Daily 2)  Ambien 10 Mg  Tabs (Zolpidem Tartrate) .Marland Kitchen.. 1 By Mouth At Bedtime 3)  Fluticasone Propionate 50 Mcg/act Susp (Fluticasone Propionate) .... Use 2 Sprays in Each Nostril Daily For Recurrent Sinusitis  Allergies (verified): No Known Drug Allergies  Past History:  Past medical, surgical, family and social histories (including risk factors) reviewed, and no changes noted (except as noted below).  Past Medical History: GERD with esophagitis and GI bleed  2006 Heart Murmur UTI cbx 2    tubal pregnancy 1993    breast cysts aspirated  followed with imaging headache  episode diplopia eval  neg mri  work up   Past Surgical History: Breast Implants CB x2 D&C x2   Colonoscopy Tubal ligation   Tonsillectomy Breast cyst aspiration  Past History:  Care Management: Dermatology: Danella Deis  Family History: Reviewed history from 04/24/2008 and no changes required. Family History Breast cancer 1st degree relative <50 Family History of Colon CA 1st degree relative <60 Family History Diabetes 1st degree relative Family History High cholesterol Family History Hypertension Family History of Stroke M 1st degree relative <50 Family  History of Cardiovascular disorder  clots grandmother        Social History: Reviewed history from 04/29/2009 and no changes required. Occupation: Married Current Smoker cigars Alcohol use-yes Drug use-no Regular exercise-no    HH of  kids moved back  one infant   57 months  old 5-6   pets cat and dog.   Sleep      Review of Systems       12 system review neg except as per hpi  ocass hot flush   Physical Exam  General:  Well-developed,well-nourished,in no acute distress; alert,appropriate and cooperative throughout examination Head:  normocephalic and atraumatic.   Eyes:  PERRL, EOMs full, conjunctiva clear    Ears:  R ear normal, L ear normal, and no external deformities.   Nose:  no external deformity.  midl congestion non tender to touch  Mouth:  good dentition and pharynx pink and moist.   Neck:  No deformities, masses, or tenderness noted. Breasts:  No mass, nodules, thickening, tenderness, bulging, retraction, inflamation, nipple discharge or skin changes noted.    well healed implant scar  Lungs:  Normal respiratory effort, chest expands symmetrically. Lungs are clear to auscultation, no crackles or wheezes. Heart:  Normal rate and regular rhythm. S1 and S2 normal without gallop, murmur, click, rub or other extra sounds. Abdomen:  Bowel sounds positive,abdomen soft and non-tender without masses, organomegaly or hernias noted. Rectal:  No external abnormalities noted. Normal sphincter tone. No rectal masses or tenderness. Genitalia:  Pelvic Exam:        External: normal female genitalia without lesions or masses        Vagina: normal without lesions or masses        Cervix: normal without lesions or masses        Adnexa: normal bimanual exam without masses or fullness        Uterus: normal by palpation        Pap smear: performed Msk:  no joint swelling, no joint warmth, and no redness over joints.   Pulses:  pulses intact without delay   Extremities:  no clubbing cyanosis or edema  Neurologic:  alert & oriented X3, strength normal in all extremities, and gait normal.   Skin:  turgor normal, color normal, no ecchymoses, no petechiae, and tattoo(s).   Cervical Nodes:  No lymphadenopathy noted Axillary Nodes:  No palpable lymphadenopathy Inguinal Nodes:  No significant adenopathy Psych:  Oriented X3, good eye contact, not anxious appearing, and not depressed appearing.     Impression & Recommendations:  Problem # 1:  HEALTH MAINTENANCE EXAM, ADULT (ICD-V70.0) continues healthy lifestyle  Orders: Venipuncture (04540) TLB-CBC Platelet - w/Differential (85025-CBCD) TLB-BMP (Basic  Metabolic Panel-BMET) (80048-METABOL) TLB-Hepatic/Liver Function Pnl (80076-HEPATIC) TLB-Lipid Panel (80061-LIPID) TLB-TSH (Thyroid Stimulating Hormone) (84443-TSH) UA Dipstick w/o Micro (automated)  (81003) Specimen Handling (98119)  Problem # 2:  ROUTINE GYNECOLOGICAL EXAMINATION (ICD-V72.31)  nl exam   Orders: Pap Smear, Thin Prep ( Collection of) (J4782)  Problem # 3:  SINUSITIS, RECURRENT (ICD-473.9) Assessment: Deteriorated ? left   maxilla  with  air travel .. if not getting better over the holiday can add antibiotic  Her updated medication list for this problem includes:    Fluticasone Propionate 50 Mcg/act Susp (Fluticasone propionate) ..... Use 2 sprays in each nostril daily for recurrent sinusitis    Amoxicillin-pot Clavulanate 875-125 Mg Tabs (Amoxicillin-pot clavulanate) .Marland Kitchen... 1 by mouth two times a day for sinusitis  Problem # 4:  SLEEP DISORDER/DISTURBANCE (ICD-780.50)  Assessment: Unchanged contniue meds   Problem # 5:  GERD (ICD-530.81) Assessment: Improved taking only about 2 x per week  Her updated medication list for this problem includes:    Protonix 40 Mg Tbec (Pantoprazole sodium) .Marland Kitchen... 1 by mouth once daily  Complete Medication List: 1)  Protonix 40 Mg Tbec (Pantoprazole sodium) .Marland Kitchen.. 1 by mouth once daily 2)  Ambien 10 Mg Tabs (Zolpidem tartrate) .Marland Kitchen.. 1 by mouth at bedtime 3)  Fluticasone Propionate 50 Mcg/act Susp (Fluticasone propionate) .... Use 2 sprays in each nostril daily for recurrent sinusitis 4)  Amoxicillin-pot Clavulanate 875-125 Mg Tabs (Amoxicillin-pot clavulanate) .Marland Kitchen.. 1 by mouth two times a day for sinusitis  Patient Instructions: 1)  continue decongestant and saline and nosespray and if pain is persistent or  progressive  can add antibiotic . 2)  continue  healthy lifestyle . 3)  You will be informed of lab results when available.  4)  yearly check up or as  indicated  .  Prescriptions: AMBIEN 10 MG  TABS (ZOLPIDEM TARTRATE) 1 by  mouth at bedtime  #90 x 1   Entered and Authorized by:   Madelin Headings MD   Signed by:   Madelin Headings MD on 04/30/2010   Method used:   Print then Give to Patient   RxID:   [id number] AMOXICILLIN-POT CLAVULANATE 875-125 MG TABS (AMOXICILLIN-POT CLAVULANATE) 1 by mouth two times a day for sinusitis  #20 x 0   Entered and Authorized by:   Madelin Headings MD   Signed by:   Madelin Headings MD on 04/30/2010   Method used:   Print then Give to Patient   RxID:   [id number]    Orders Added: 1)  Venipuncture [84696] 2)  TLB-CBC Platelet - w/Differential [85025-CBCD] 3)  TLB-BMP (Basic Metabolic Panel-BMET) [80048-METABOL] 4)  TLB-Hepatic/Liver Function Pnl [80076-HEPATIC] 5)  TLB-Lipid Panel [80061-LIPID] 6)  TLB-TSH (Thyroid Stimulating Hormone) [84443-TSH] 7)  UA Dipstick w/o Micro (automated)  [81003] 8)  Specimen Handling [99000] 9)  Est. Patient 40-64 years [99396] 10)  Pap Smear, Thin Prep ( Collection of) [Q0091] 11)  Est. Patient Level II [29528]    Laboratory Results   Urine Tests  Date/Time Received: April 30, 2010 11:34 AM   Routine Urinalysis   Color: yellow Appearance: Clear Glucose: negative   (Normal Range: Negative) Bilirubin: negative   (Normal Range: Negative) Ketone: negative   (Normal Range: Negative) Spec. Gravity: 1.020   (Normal Range: 1.003-1.035) Blood: trace-lysed   (Normal Range: Negative) pH: 5.5   (Normal Range: 5.0-8.0) Protein: negative   (Normal Range: Negative) Urobilinogen: 0.2   (Normal Range: 0-1) Nitrite: negative   (Normal Range: Negative) Leukocyte Esterace: negative   (Normal Range: Negative)    Comments: Romualdo Bolk, CMA Duncan Dull)  April 30, 2010 11:34 AM      Preventive Care Screening  Mammogram:    Date:  02/09/2010    Results:  cyst(s) bilateral   Prior Values:    Pap Smear:  NEGATIVE FOR INTRAEPITHELIAL LESIONS OR MALIGNANCY. (04/29/2009)    Mammogram:  normal (10/09/2008)    Colonoscopy:   normal (09/19/2006)    Last Tetanus Booster:  Historical (06/14/1996)  Appended Document: Immunization Entry     Immunization History:  Tetanus/Td Immunization History:    Tetanus/Td:  tdap (10/11/2005)

## 2010-07-16 NOTE — Letter (Signed)
Summary: Generic Letter  Marcia Irwin at [facility]  [address] [place], [place] [address]   Phone: [phone]  Fax: [phone]    05/01/2010  [name] [address] [place], [place]  [address]  Dear Ms. [name],  (1) CBC Platelet w/Diff (CBCD)   White Cell Count          7.3 K/uL                    4.5-10.5   Red Cell Count            4.63 Mil/uL                 3.87-5.11   Hemoglobin                13.7 g/dL                   95.2-84.1   Hematocrit                40.2 %                      36.0-46.0   MCV                       86.9 fl                     78.0-100.0   MCHC                      34.0 g/dL                   32.4-40.1   RDW                       13.1 %                      11.5-14.6   Platelet Count            256.0 K/uL                  150.0-400.0   Neutrophil %              48.2 %                      43.0-77.0   Lymphocyte %              44.0 %                      12.0-46.0   Monocyte %                6.4 %                       3.0-12.0   Eosinophils%              0.9 %                       0.0-5.0   Basophils %               0.5 %                       0.0-3.0   Neutrophill Absolute      3.5 K/uL  1.4-7.7   Lymphocyte Absolute       3.2 K/uL                    0.7-4.0   Monocyte Absolute         0.5 K/uL                    0.1-1.0  Eosinophils, Absolute                             0.1 K/uL                    0.0-0.7   Basophils Absolute        0.0 K/uL                    0.0-0.1  Tests: (2) BMP (METABOL)   Sodium                    138 mEq/L                   135-145   Potassium                 3.8 mEq/L                   3.5-5.1   Chloride                  100 mEq/L                   96-112   Carbon Dioxide            28 mEq/L                    19-32   Glucose                   84 mg/dL                    04-54   BUN                       16 mg/dL                    0-98   Creatinine                0.9 mg/dL                    1.1-9.1   Calcium                   9.2 mg/dL                   4.7-82.9   GFR                       70.01 mL/min                >60  Tests: (3) Hepatic/Liver Function Panel (HEPATIC)   Total Bilirubin           0.6 mg/dL                   5.6-2.1   Direct Bilirubin          0.1 mg/dL  0.0-0.3   Alkaline Phosphatase      56 U/L                      39-117   AST                       18 U/L                      0-37   ALT                       18 U/L                      0-35   Total Protein             6.9 g/dL                    3.0-1.6   Albumin                   4.3 g/dL                    0.1-0.9  Tests: (4) Lipid Panel (LIPID)   Cholesterol          [H]  237 mg/dL                   3-235     ATP III Classification            Desirable:  < 200 mg/dL                    Borderline High:  200 - 239 mg/dL               High:  > = 240 mg/dL   Triglycerides             85.0 mg/dL                  5.7-322.0     Normal:  <150 mg/dL     Borderline High:  254 - 199 mg/dL   HDL                       27.06 mg/dL                 >23.76   VLDL Cholesterol          17.0 mg/dL                  2.8-31.5  CHO/HDL Ratio:  CHD Risk                             4                    Men          Women     1/2 Average Risk     3.4          3.3     Average Risk          5.0          4.4     2X Average Risk          9.6          7.1     3X Average  Risk          15.0          11.0                           Tests: (5) TSH (TSH)   FastTSH                   1.03 uIU/mL                 0.35-5.50  Tests: (6) Cholesterol LDL - Direct (DIRLDL)  Cholesterol LDL - Direct                             162.3 mg/dL     Optimal:  <606 mg/dL     Near or Above Optimal:  100-129 mg/dL     Borderline High:  301-601 mg/dL     High:  093-235 mg/dL     Very High:  >573 mg/dL   Your labs are normal except your cholesterol is up from last time. Attend to your healthy lifestyle and  exercise. We will repeat these at your next check up. If you have an questions, please give [place] a call at [phone].         Sincerely,   Tor Netters, CMA (AAMA)

## 2010-07-16 NOTE — Progress Notes (Signed)
Summary: refill on ambien  Phone Note From Pharmacy   Caller: [facility] Reason for Call: Needs renewal Details for Reason: Ambien 10mg  Initial call taken by: Romualdo Bolk, CMA (AAMA),  July 01, 2009 5:04 PM  Follow-up for Phone Call        ok to refill x 4 (do 90 with 1 refilll) Follow-up by: Madelin Headings MD,  July 01, 2009 5:37 PM  Additional Follow-up for Phone Call Additional follow up Details #1::        Sent back via fax Additional Follow-up by: Romualdo Bolk, CMA (AAMA),  July 02, 2009 9:25 AM    New/Updated Medications: AMBIEN 10 MG  TABS (ZOLPIDEM TARTRATE) 1 by mouth at bedtime Prescriptions: AMBIEN 10 MG  TABS (ZOLPIDEM TARTRATE) 1 by mouth at bedtime  #90 x 1   Entered by:   Romualdo Bolk, CMA (AAMA)   Authorized by:   Madelin Headings MD   Signed by:   Romualdo Bolk, CMA (AAMA) on 07/02/2009   Method used:   Handwritten   RxID:   [id number]

## 2010-10-13 ENCOUNTER — Encounter: Payer: Self-pay | Admitting: Internal Medicine

## 2010-10-27 NOTE — Letter (Signed)
May 23, 2008    Marcia Irwin. Fabian Sharp, MD  [address] [place], [place] [address]   RE:  Marcia Irwin  MRN:  [id number]  /  DOB:  20-May-1956   Dear Dr. Fabian Sharp:   It was my pleasure to see your patient Marcia Irwin in cardiology  consultation today regarding her presyncopal symptoms and tachycardia.  As you recall, Marcia Irwin is a very pleasant 55 year old female  without significant past medical history who presented to the [facility]  Emergency Department on March 26, 2008 after a presyncopal episode.  She reports being at work, sitting at her desk, when she developed a  severe headache with associated dizziness and blurred vision.  She  reports having a fluttering sensation in her throat as well as chest  tightness.  She reports heart pounding, but denies heart racing.  She  did not pass out.  She is unaware of any triggers for the event.  EMS  was called and the patient was brought to [facility].  Upon  arrival, she was found to have sinus rhythm at 90 beats per minute.  She  was discharged and did well initially.  Approximately, 2 weeks later  while sitting again at work in a meeting she developed similar dizziness  and subjective shortness of breath with a fluttering sensation within  her throat.  These symptoms lasted several minutes without presyncope or  syncope and spontaneously resolved.  Again, she is unaware of any  triggers.  Finally, on Thanksgiving morning while cooking, she developed  acute dizziness and shortness of breath was tremulous.  She denies any  heart racing, chest discomfort, presyncope, or syncope.  Her symptoms  lasted several minutes and then resolved.  Since that time, the patient  has been diligently journaling her heart rate and blood pressures.  She  finds that her heart rate ranges 110-120 beats per minute typically with  blood pressures in the 80s-120s/60s-90s.  She typically denies symptoms  of presyncope or  syncope.  She maintains an active and healthy  lifestyle.  She denies chest discomfort or shortness of breath with  exertion or activities.  She denies episodes of symptomatic heart  racing and denies any other episodes of presyncope or syncope.  Presently, she is doing well and is without complaint today.   PAST MEDICAL HISTORY:  1. Gastroesophageal reflux disease.  2. Gastric ulcer with gastrointestinal bleeding in November 2007.  3. Status post breast augmentation.  4. Status post tonsillectomy.   ALLERGIES:  No known drug allergies.   CURRENT MEDICATIONS:  1. Ambien 10 mg at bedtime.  2. Protonix 40 mg daily.  3. Multivitamin daily.  4. Vitamin D daily.   SOCIAL HISTORY:  The patient lives in [place].  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  She denies tobacco use and rarely drinks  alcohol.  She denies drug use.   FAMILY HISTORY:  Notable for breast cancer, colon cancer, diabetes, and  hypertension.   REVIEW OF SYSTEMS:  All systems are reviewed and negative except as  outlined in the HPI above.   PHYSICAL EXAMINATION:  VITAL SIGNS:  Blood pressure lying 104/64 with a  heart rate of 80, blood pressure standing 112/82 with a heart rate of  107, respirations 18, and weight 142 pounds.  GENERAL:  The patient is a well-appearing female, in no acute distress.  She is alert and oriented x3.  HEENT:  Normocephalic and atraumatic.  Sclerae are  clear.  Conjunctivae  pink.  Oropharynx clear.  NECK:  Supple.  No JVD, lymphadenopathy, or bruits.  LUNGS:  Clear to auscultation bilaterally.  HEART:  Regular rate and rhythm.  No murmurs, rubs, or gallops.  GI:  Soft, nontender, and nondistended.  Positive bowel sounds.  EXTREMITIES:  No clubbing, cyanosis, or edema.  NEUROLOGIC:  Cranial nerves II through XII are intact.  Strength and  sensation are intact.  SKIN:  No ecchymosis or laceration.  MUSCULOSKELETAL:  No deformity or atrophy.  PSYCH:  Euthymic mood, full  affect.   EKG reviewed from March 26, 2008 reveals sinus rhythm at 90 beats per  minute with a PR duration 134 milliseconds and a QT interval 403  milliseconds with no ST or T-wave changes.   Transthoracic echocardiogram performed on May 15, 2008 reveals a  left ventricular end-diastolic dimension of 39 with a left atrial size  of 34 mm and an ejection fraction of 65% with no focal wall motion  abnormalities.  There is no significant valvular disease or other  evidence of structural heart disease.   IMPRESSION:  Marcia Irwin is a very pleasant 55 year old female  without significant past medical history who presents for cardiology  consultation following a recent episode of presyncope at work.  She also  presents with documented episodes of hypotension and tachycardia at  home.  The patient has no exertional symptoms to suggest ischemia and  her echocardiogram reveals no evidence of structural heart disease.  I  have reviewed her EKG, which also appears to be normal.  The patient's  history is most consistent with a vasovagal syncope.  She is mildly  orthostatic by heart rate in clinic today.  I suspected that volume  depletion has contributed to the patient's symptomatic episodes.  I have  therefore recommended that she maintain adequate hydration and slightly  liberalize her salt intake.  I do not believe that further cardiac  evaluation is necessary at this time.  I have also encouraged behavioral  modifications including rising slowly from a seated position and  assuming recumbent position should she develop further presyncope.   PLAN:  1. Oral hydration is encouraged.  2. Salt liberalized diet.  3. The patient is instructed to contact our office should she have      increasing frequency and duration of symptoms.   I appreciate the opportunity for participating in the care of [name].  Please feel free to contact me if you wish to discuss her  care  further.    Sincerely,      Hillis Range, MD  Electronically Signed    JA/MedQ  DD: 05/23/2008  DT: 05/24/2008  Job #: [id number]

## 2010-10-30 NOTE — Consult Note (Signed)
Marcia Irwin, Marcia Irwin NO.:  [id number]   MEDICAL RECORD NO.:  [id number]          PATIENT TYPE:  INP   LOCATION:  0157                         FACILITY:  [facility]   PHYSICIAN:  Iva Boop, M.D. LHCDATE OF BIRTH:  31-Jul-1955   DATE OF CONSULTATION:  05/03/2005  DATE OF DISCHARGE:                                   CONSULTATION   REQUESTING PHYSICIAN:  Rene Paci, M.D.   PRIMARY CARE PHYSICIAN:  Neta Mends. Panosh, M.D.   REASON FOR CONSULTATION:  Hematemesis.   ASSESSMENT:  Nausea, vomiting, epigastric pain, and coffee-ground  emesis/hematemesis:  The patient has a mild anemia.  She has had increasing  heartburn symptoms over the past several weeks, and they have been  intermittent for a number of years.  Some of her pain at this point is  musculoskeletal, related to vomiting.  I think she is having some epigastric  and lower chest pain.   RECOMMENDATIONS/PLANS:  Proceed with upper GI endoscopy to look for cause of  bleeding, anemia, and heartburn.   Risks, benefits and indications are explained to the patient, and she  understands and agrees to proceed.   HISTORY:  This is a pleasant 55 year old white female that I know from  previous colonoscopy and polypectomy a year ago.  She has a family history  of colon cancer, and she had colon polyps on August 21, 2003.  She has been  having some increasing heartburn problems, using some Tums.  She presented  to the emergency room last night with nausea and heaviness and pressure in  the left chest, felt like she could not get a deep breath, with some  discomfort between the shoulder blades.  So far, she has ruled out for MI.  EKG has been negative.  She has vomited several times, what was thought  brown and apparently Hemoccult positive.  She feels a little bit better at  this time.  There is no esophageal dysphagia.  She has had an abdominal  ultrasound and chest CT, both negative.  She has not really been  losing  weight.   MEDICATIONS:  Really none at home other than Tums and vitamins.   ALLERGIES:  None known.   PAST MEDICAL HISTORY:  Colon polyps.   FAMILY HISTORY:  Paternal grandfather had colon cancer.  Father had colon  cancer as well.   SOCIAL HISTORY:  She is married.  Social alcohol.  No tobacco.   REVIEW OF SYSTEMS:  As above.  Wears eye glasses.  Otherwise, comprehensive  review of systems is negative.   PHYSICAL EXAMINATION:  GENERAL:  A pleasant, well-developed and well-  nourished white female in no acute distress.  VITAL SIGNS:  Temperature 98.2, blood pressure 104/50, pulse 110.  HEENT:  Eyes:  Anicteric.  Mouth:  Free of lesions.  NECK:  Supple.  CHEST:  Clear.  HEART:  S1 and S2.  No murmurs, gallops or rubs.  ABDOMEN:  Soft.  Mildly tender in the epigastrium and tender on the xiphoid  process and lower sternal area as well.  EXTREMITIES:  No edema.  NEURO:  She is  alert and oriented x3.   LAB DATA:  Hemoglobin 12.5 to 11.9, platelets normal, white count normal.  BUN 10, creatinine 0.9.  LFTs normal.  Albumin 3.7, lipase 17.   I appreciate the opportunity to care for this patient.      Iva Boop, M.D. [facility]  Electronically Signed     CEG/MEDQ  D:  05/03/2005  T:  05/03/2005  Job:  [id number]   cc:   Neta Mends. Fabian Sharp, M.D. [facility]  [address] [place]  [place] [address]

## 2010-10-30 NOTE — Discharge Summary (Signed)
Marcia Irwin, DIERS NO.:  [id number]   MEDICAL RECORD NO.:  [id number]          PATIENT TYPE:  INP   LOCATION:  0157                         FACILITY:  [facility]   PHYSICIAN:  Rene Paci, M.D. LHCDATE OF BIRTH:  09/08/1955   DATE OF ADMISSION:  05/02/2005  DATE OF DISCHARGE:  05/04/2005                                 DISCHARGE SUMMARY   DISCHARGE DIAGNOSES:  1.  Epigastric pain with nausea and vomiting, rule out hematemesis.  2.  Grade 2 erosive esophagitis.  3.  Mild hypokalemia, resolved.   DISCHARGE MEDICATIONS:  1.  Protonix 40 mg p.o. daily x1 month or other equivalent PPI x1 month,      then p.r.n. symptoms.  2.  May continue Tylenol and multivitamins as prior to admission.   CONSULTATIONS:  Dr. Stan Head of [facility].   PROCEDURE:  Upper endoscopy on November 20th showing grade A erosive  esophagitis.   CT scan of the chest done on November 19th negative for PE or other  pulmonary process.   Abdominal ultrasound on November 20th negative.   DISPOSITION:  Patient is discharged home in a medically stable condition.  She understands the plans for medication, treatment, and followup as well as  resultant procedures done here in the hospital.  She is tolerating a regular  diet and medications without problems.   CONDITION ON DISCHARGE:  Medically improved and stable.   HOSPITAL COURSE:  Epigastric pain with nausea and vomiting:  Patient is a  pleasant 55 year old woman, basically healthy, who presents to the emergency  room the evening prior to admission complaining of substernal burning and  pain associated with shortness of breath.  There was concern for PE, and she  underwent a CT angio in the emergency room, which was fortunately negative  during this time course.  The nature of the pain migrated downward and  became more localized to the epigastrium.  EKG was negative.  Cardiac  enzymes, point of care, were negative x2.  An abdominal  ultrasound was  obtained.  LFTs and other labs are normal.  En route to ultrasound, the  patient began to vomit.  On the third vomit, there was question of coffee-  ground emesis, guaiac positive, by ER nursing report. As such, she was  admitted for this pain, nausea and vomiting, to rule out upper GI bleed.  She was seen in consultation by GI, who performed an endoscopy and upper  EGD.  This showed grade A risk of esophagitis and chronic reflux changes.  She had been treated with IV Protonix since admission, and her hemoglobin  remained stable.  No evidence of bleeding.  After overnight observation  within the hospital, the patient's symptoms had  resolved.  No greater nausea, vomiting, or diarrhea.  Suspect infectious  gastroenteritis trigger atop chronic reflux.  Stable for discharge home, as  described above.  Mild hypokalemia at the time of admission resolved to 4 at  the time of discharge.      Rene Paci, M.D. [facility]  Electronically Signed     VL/MEDQ  D:  05/04/2005  T:  05/04/2005  Job:  [id number]

## 2010-11-17 ENCOUNTER — Telehealth: Payer: Self-pay | Admitting: Internal Medicine

## 2010-11-17 MED ORDER — CIPROFLOXACIN HCL 500 MG PO TABS: 500.0000 mg | ORAL_TABLET | Freq: Two times a day (BID) | ORAL | Status: AC

## 2010-11-17 MED ORDER — ZOLPIDEM TARTRATE 10 MG PO TABS
10.0000 mg | ORAL_TABLET | Freq: Every evening | ORAL | Status: DC | PRN
Start: 2010-11-17 — End: 2011-02-02

## 2010-11-17 NOTE — Telephone Encounter (Signed)
Per Dr. Fabian Sharp- okay to do cipro 500 #10 1 po bid and ok to do Ambien. Rx sent to pharmacy.

## 2010-11-17 NOTE — Telephone Encounter (Signed)
[place] and it is almost time for a refill of Ambien, which will be due while she in [place]. Wants also an abx, just in case. [facility]

## 2010-11-19 ENCOUNTER — Telehealth: Payer: Self-pay | Admitting: *Deleted

## 2010-12-31 NOTE — Telephone Encounter (Signed)
Take care of

## 2011-02-01 ENCOUNTER — Telehealth: Payer: Self-pay | Admitting: Internal Medicine

## 2011-02-01 NOTE — Telephone Encounter (Signed)
Can do 90 days x 1 . She is due for a cpx or yearly check before next refill.Marland Kitchen

## 2011-02-01 NOTE — Telephone Encounter (Signed)
Pt needs refill on Zolpidem. [facility]ses [facility].

## 2011-02-02 MED ORDER — ZOLPIDEM TARTRATE 10 MG PO TABS: ORAL_TABLET | ORAL | Status: DC

## 2011-02-02 NOTE — Telephone Encounter (Signed)
rx sent to pharmacy

## 2011-02-02 NOTE — Telephone Encounter (Signed)
Addended by: Romualdo Bolk on: 02/02/2011 01:15 PM   Modules accepted: Orders

## 2011-03-16 LAB — PREGNANCY, URINE: Preg Test, Ur: NEGATIVE

## 2011-03-16 LAB — CBC
Platelets: 296
RDW: 13.2

## 2011-03-16 LAB — URINALYSIS, ROUTINE W REFLEX MICROSCOPIC
Bilirubin Urine: NEGATIVE
Glucose, UA: NEGATIVE
Ketones, ur: NEGATIVE
Protein, ur: NEGATIVE
Specific Gravity, Urine: 1.009
pH: 7

## 2011-03-16 LAB — B-NATRIURETIC PEPTIDE (CONVERTED LAB): Pro B Natriuretic peptide (BNP): 32

## 2011-03-16 LAB — DIFFERENTIAL
Basophils Absolute: 0
Lymphocytes Relative: 31
Neutro Abs: 4.7
Neutrophils Relative %: 60

## 2011-03-16 LAB — POCT CARDIAC MARKERS
CKMB, poc: <1 — ABNORMAL LOW
Myoglobin, poc: 40.8
Troponin i, poc: <0.05

## 2011-03-16 LAB — POCT I-STAT, CHEM 8
Calcium, Ion: 1.22
Chloride: 107
HCT: 41
Hemoglobin: 13.9
TCO2: 24

## 2011-03-16 LAB — D-DIMER, QUANTITATIVE: D-Dimer, Quant: <0.22

## 2011-03-16 LAB — URINE MICROSCOPIC-ADD ON

## 2011-07-13 ENCOUNTER — Other Ambulatory Visit: Payer: Self-pay | Admitting: *Deleted

## 2011-07-13 NOTE — Telephone Encounter (Signed)
Refill on ambien last filled on 8.21.12 LOV 04/30/10 NOV none

## 2011-07-14 MED ORDER — ZOLPIDEM TARTRATE 10 MG PO TABS: ORAL_TABLET | ORAL | Status: DC

## 2011-07-14 NOTE — Telephone Encounter (Signed)
Pt aware and will call back to make an appt. Pt does want this sent to the mail order company. Rx called in.

## 2011-07-14 NOTE — Telephone Encounter (Signed)
It has been over a years since seen.Marland Kitchen Disp 14 only  Until she gets in for OV.

## 2011-08-10 ENCOUNTER — Encounter: Payer: Self-pay | Admitting: Internal Medicine

## 2011-08-10 ENCOUNTER — Ambulatory Visit (INDEPENDENT_AMBULATORY_CARE_PROVIDER_SITE_OTHER): Payer: BC Managed Care – PPO | Admitting: Internal Medicine

## 2011-08-10 VITALS — BP 120/80 | HR 66 | Wt 129.0 lb

## 2011-08-10 DIAGNOSIS — Z5189 Encounter for other specified aftercare: Secondary | ICD-10-CM

## 2011-08-10 DIAGNOSIS — G4700 Insomnia, unspecified: Secondary | ICD-10-CM

## 2011-08-10 DIAGNOSIS — J310 Chronic rhinitis: Secondary | ICD-10-CM

## 2011-08-10 DIAGNOSIS — R0989 Other specified symptoms and signs involving the circulatory and respiratory systems: Secondary | ICD-10-CM | POA: Insufficient documentation

## 2011-08-10 DIAGNOSIS — Z79899 Other long term (current) drug therapy: Secondary | ICD-10-CM | POA: Insufficient documentation

## 2011-08-10 MED ORDER — ZOLPIDEM TARTRATE 10 MG PO TABS: 10.0000 mg | ORAL_TABLET | Freq: Every evening | ORAL | Status: DC | PRN

## 2011-08-10 MED ORDER — FLUTICASONE PROPIONATE 50 MCG/ACT NA SUSP: 2.0000 | Freq: Every day | NASAL | Status: DC

## 2011-08-10 NOTE — Progress Notes (Signed)
  Subjective:    Patient ID: Marcia Irwin, female    DOB: April 21, 1956, 56 y.o.   MRN: [id number]  HPI Patient comes in today for follow up of  Medication management for medical problems.  Last visit was 11 2011. At that time she has had no major changes in her health status.   Insomnia Takse  ambien 5-10 mg  Every night  No sig se.  Unless takes it late travels a lot in her job can't sleep without it at this point is aware of side effects potential and dependency. Does not take it with alcohol. Works about 60 hours a week   Nose congestion   Helps with flonase  And off for a while. Needs a refill  Review of Systems ocass hard to get of breath.   No doe and no change in exercise tolerance coughing fevers weight loss swollen glands unusual bleeding thumb. Menopausal symptoms at times. GERD  Past history family history social history reviewed in the electronic medical record. Outpatient Prescriptions Prior to Visit  Medication Sig Dispense Refill  . zolpidem (AMBIEN) 10 MG tablet 1 tab daily at bedtime as needed. Pt needs to schedule a physical appointment before next refill.  14 tablet  0       Objective:   Physical Exam Well-developed well-nourished no acute distress HEENT: Normocephalic ;atraumatic , Eyes;  PERRL, EOMs  Full, lids and conjunctiva clear,,Ears: no deformities, canals nl, TM landmarks normal, Nose: no deformity or discharge   somewhat congested no face pain Mouth : OP clear without lesion or edema . Neck: Supple without adenopathy or masses or bruits Chest:  Clear to A&P without wheezes rales or rhonchi CV:  S1-S2 no gallops or murmurs peripheral perfusion is normal Abdomen:  Sof,t normal bowel sounds without hepatosplenomegaly, no guarding rebound or masses no CVA tenderness No clubbing cyanosis or edema Oriented x 3. Normal cognition, attention, speech. Not anxious or depressed appearing   Good eye contact .      Assessment & Plan:  Insomnia  on chronic  medications discussed risk benefits potential side effect recent advisory to decrease to 5 mg in women at this time she gets more benefit than risk. Will refill medication and followup  Allergic rhinitis  chronic rhinitis helped in the past by Flonase we'll refill medication through [facility]  Feeling of inability to take a deep breath or air hunger this really appears to not be cardiovascular or lower pulmonary could be related to nasal congestion and having to mouth breathe.  Discussed alarm symptoms that she should return for.   Health care maintenance she is overdue for a checkup has a busy schedule we'll plan for this in 3 months she can see a gynecologist if she wishes. Reviewed Pap smear recommendations and mammogram recommendations for average risk.  Address other issues at that time.

## 2011-08-10 NOTE — Patient Instructions (Addendum)
Care with the sleep agent. Can schedule   Check up with labs in the meantime.   Restart the flonase and may help the breathing .

## 2011-08-30 ENCOUNTER — Encounter: Payer: Self-pay | Admitting: Internal Medicine

## 2011-09-02 ENCOUNTER — Encounter: Payer: Self-pay | Admitting: Internal Medicine

## 2011-09-13 ENCOUNTER — Encounter: Payer: Self-pay | Admitting: Internal Medicine

## 2011-10-28 ENCOUNTER — Ambulatory Visit (AMBULATORY_SURGERY_CENTER): Payer: BC Managed Care – PPO | Admitting: *Deleted

## 2011-10-28 VITALS — Ht 61.5 in | Wt 134.9 lb

## 2011-10-28 DIAGNOSIS — Z1211 Encounter for screening for malignant neoplasm of colon: Secondary | ICD-10-CM

## 2011-10-28 MED ORDER — PEG-KCL-NACL-NASULF-NA ASC-C 100 G PO SOLR: ORAL | Status: DC

## 2011-11-11 ENCOUNTER — Ambulatory Visit (AMBULATORY_SURGERY_CENTER): Payer: BC Managed Care – PPO | Admitting: Internal Medicine

## 2011-11-11 ENCOUNTER — Encounter: Payer: Self-pay | Admitting: Internal Medicine

## 2011-11-11 VITALS — BP 117/62 | HR 74 | Temp 95.5°F | Resp 18 | Ht 61.0 in | Wt 134.0 lb

## 2011-11-11 DIAGNOSIS — Z1211 Encounter for screening for malignant neoplasm of colon: Secondary | ICD-10-CM

## 2011-11-11 DIAGNOSIS — Z800 Family history of malignant neoplasm of digestive organs: Secondary | ICD-10-CM

## 2011-11-11 DIAGNOSIS — D126 Benign neoplasm of colon, unspecified: Secondary | ICD-10-CM

## 2011-11-11 DIAGNOSIS — Z86010 Personal history of colonic polyps: Secondary | ICD-10-CM

## 2011-11-11 MED ORDER — SODIUM CHLORIDE 0.9 % IV SOLN: 500.0000 mL | INTRAVENOUS | Status: DC

## 2011-11-11 NOTE — Patient Instructions (Signed)
YOU HAD AN ENDOSCOPIC PROCEDURE TODAY AT THE [facility]: Refer to the procedure report that was given to you for any specific questions about what was found during the examination.  If the procedure report does not answer your questions, please call your gastroenterologist to clarify.  If you requested that your care partner not be given the details of your procedure findings, then the procedure report has been included in a sealed envelope for you to review at your convenience later.  YOU SHOULD EXPECT: Some feelings of bloating in the abdomen. Passage of more gas than usual.  Walking can help get rid of the air that was put into your GI tract during the procedure and reduce the bloating. If you had a lower endoscopy (such as a colonoscopy or flexible sigmoidoscopy) you may notice spotting of blood in your stool or on the toilet paper. If you underwent a bowel prep for your procedure, then you may not have a normal bowel movement for a few days.  DIET: Your first meal following the procedure should be a light meal and then it is ok to progress to your normal diet.  A half-sandwich or bowl of soup is an example of a good first meal.  Heavy or fried foods are harder to digest and may make you feel nauseous or bloated.  Likewise meals heavy in dairy and vegetables can cause extra gas to form and this can also increase the bloating.  Drink plenty of fluids but you should avoid alcoholic beverages for 24 hours.  ACTIVITY: Your care partner should take you home directly after the procedure.  You should plan to take it easy, moving slowly for the rest of the day.  You can resume normal activity the day after the procedure however you should NOT DRIVE or use heavy machinery for 24 hours (because of the sedation medicines used during the test).    SYMPTOMS TO REPORT IMMEDIATELY: A gastroenterologist can be reached at any hour.  During normal business hours, 8:30 AM to 5:00 PM Monday through Friday,  call [phone].  After hours and on weekends, please call the GI answering service at [phone] who will take a message and have the physician on call contact you.   Following lower endoscopy (colonoscopy or flexible sigmoidoscopy):  Excessive amounts of blood in the stool  Significant tenderness or worsening of abdominal pains  Swelling of the abdomen that is new, acute  Fever of 100F or higher  FOLLOW UP: If any biopsies were taken you will be contacted by phone or by letter within the next 1-3 weeks.  Call your gastroenterologist if you have not heard about the biopsies in 3 weeks.  Our staff will call the home number listed on your records the next business day following your procedure to check on you and address any questions or concerns that you may have at that time regarding the information given to you following your procedure. This is a courtesy call and so if there is no answer at the home number and we have not heard from you through the emergency physician on call, we will assume that you have returned to your regular daily activities without incident.  SIGNATURES/CONFIDENTIALITY: You and/or your care partner have signed paperwork which will be entered into your electronic medical record.  These signatures attest to the fact that that the information above on your After Visit Summary has been reviewed and is understood.  Full responsibility of the confidentiality of this   discharge information lies with you and/or your care-partner.    Resume medications. Information given on polyps with discharge instructions. 

## 2011-11-11 NOTE — Op Note (Signed)
[facility] [address] [place], [place]  [address]  COLONOSCOPY PROCEDURE REPORT  PATIENT:  Marcia Irwin, Marcia Irwin  MR#:  [id number] BIRTHDATE:  07-03-1955, 55 yrs. old  GENDER:  female ENDOSCOPIST:  Iva Boop, MD, [facility]  PROCEDURE DATE:  11/11/2011 PROCEDURE:  Colonoscopy with biopsy and snare polypectomy ASA CLASS:  Class I INDICATIONS:  surveillance and high-risk screening, history of pre-cancerous (adenomatous) colon polyps, family history of colon cancer diminutive adenoma in 2008, father and paternal grandfather have had colon cancer MEDICATIONS:   These medications were titrated to patient response per physician's verbal order, Fentanyl 75 mcg IV, Versed 9 mg IV  DESCRIPTION OF PROCEDURE:   After the risks benefits and alternatives of the procedure were thoroughly explained, informed consent was obtained.  Digital rectal exam was performed and revealed no abnormalities.   The LB [id number] endoscope was introduced through the anus and advanced to the cecum, which was identified by both the appendix and ileocecal valve, without limitations.  The quality of the prep was excellent, using MoviPrep.  The instrument was then slowly withdrawn as the colon was fully examined. <<PROCEDUREIMAGES>>  FINDINGS:  Two polyps were found. 1-2 mm cecal polyp removed with cold biopsy and 4-5 mm descending polyp removed with cold snare. Both sent to pathology.  This was otherwise a normal examination of the colon. Includes right colon retroflexion.   Retroflexed views in the rectum revealed no abnormalities.    The time to cecum = 4:45 minutes. The scope was then withdrawn in 13:53 minutes from the cecum and the procedure completed. COMPLICATIONS:  None ENDOSCOPIC IMPRESSION: 1) Two polyps removed, largest 4-5 mm 2) Otherwise normal examination with excellent prep 3) Personal history of diminutive adenoma in 2008 4) Family history of colon cancer (father and  paternal grandfather)  REPEAT EXAM:  In for Colonoscopy, pending biopsy results. Probably 5 years  Iva Boop, MD, [facility]  CC:  The Patient  n. eSIGNED:   Iva Boop at 11/11/2011 09:38 AM  [name], [phone]

## 2011-11-11 NOTE — Progress Notes (Signed)
Patient did not experience any of the following events: a burn prior to discharge; a fall within the facility; wrong site/side/patient/procedure/implant event; or a hospital transfer or hospital admission upon discharge from the facility. (G8907) Patient did not have preoperative order for IV antibiotic SSI prophylaxis. (G8918)  

## 2011-11-12 ENCOUNTER — Telehealth: Payer: Self-pay | Admitting: *Deleted

## 2011-11-12 ENCOUNTER — Encounter: Payer: BC Managed Care – PPO | Admitting: Internal Medicine

## 2011-11-12 NOTE — Telephone Encounter (Signed)
Left message

## 2011-11-16 ENCOUNTER — Encounter: Payer: Self-pay | Admitting: Internal Medicine

## 2011-11-16 NOTE — Progress Notes (Signed)
Quick Note:  2 adenomas Repeat colonoscopy about 11/2016 ______

## 2012-03-10 ENCOUNTER — Telehealth: Payer: Self-pay | Admitting: Family Medicine

## 2012-03-10 NOTE — Telephone Encounter (Signed)
Patient requesting refills.  Last sen 08/10/11.  Also filled on that day #90 with 1 refill.  90 day rx.  Please advise.  Thanks!!!

## 2012-03-13 NOTE — Telephone Encounter (Signed)
Due for ov (she canceled the cpx in MAY . )  Ok to refill 60 days  until she can get in for med check OV.  Please have her make appt for med check. Before above runs out.

## 2012-03-15 ENCOUNTER — Other Ambulatory Visit: Payer: Self-pay | Admitting: Family Medicine

## 2012-03-15 MED ORDER — ZOLPIDEM TARTRATE 10 MG PO TABS: 10.0000 mg | ORAL_TABLET | Freq: Every evening | ORAL | Status: DC | PRN

## 2012-03-15 NOTE — Telephone Encounter (Signed)
Left message on personal cell informing the pt that the rx was sent in to local pharmacy as [facility] will not give a 90 day supply until she is seen in the office.  [facility] [address].

## 2012-07-18 ENCOUNTER — Telehealth: Payer: Self-pay | Admitting: Family Medicine

## 2012-07-18 NOTE — Telephone Encounter (Signed)
Patient is requesting refills through [facility] (90 day supply).  Last seen on 08/10/11.  She should have returned in May.  She cancelled that appointment.  No future appointment made.  Please advise.  Thanks!!

## 2012-07-19 ENCOUNTER — Other Ambulatory Visit: Payer: Self-pay | Admitting: Family Medicine

## 2012-07-19 ENCOUNTER — Other Ambulatory Visit: Payer: Self-pay | Admitting: Internal Medicine

## 2012-07-19 MED ORDER — ZOLPIDEM TARTRATE 10 MG PO TABS: 10.0000 mg | ORAL_TABLET | Freq: Every evening | ORAL | Status: DC | PRN

## 2012-07-19 NOTE — Telephone Encounter (Signed)
Can refill 30#  OV before runs out .

## 2012-07-19 NOTE — Telephone Encounter (Signed)
[facility] [address].  Left patient a message on personally identified cell that a 30 day supply was called in and she will need to make an appt for further refills.

## 2012-08-18 ENCOUNTER — Ambulatory Visit: Payer: BC Managed Care – PPO | Admitting: Internal Medicine

## 2012-08-23 ENCOUNTER — Encounter: Payer: Self-pay | Admitting: Internal Medicine

## 2012-08-23 ENCOUNTER — Ambulatory Visit (INDEPENDENT_AMBULATORY_CARE_PROVIDER_SITE_OTHER): Payer: BC Managed Care – PPO | Admitting: Internal Medicine

## 2012-08-23 VITALS — BP 114/76 | HR 87 | Temp 98.4°F | Wt 136.0 lb

## 2012-08-23 DIAGNOSIS — J310 Chronic rhinitis: Secondary | ICD-10-CM

## 2012-08-23 DIAGNOSIS — Z79899 Other long term (current) drug therapy: Secondary | ICD-10-CM

## 2012-08-23 DIAGNOSIS — K219 Gastro-esophageal reflux disease without esophagitis: Secondary | ICD-10-CM

## 2012-08-23 MED ORDER — ZOLPIDEM TARTRATE 5 MG PO TABS: 5.0000 mg | ORAL_TABLET | Freq: Every evening | ORAL | Status: DC | PRN

## 2012-08-23 MED ORDER — FLUTICASONE PROPIONATE 50 MCG/ACT NA SUSP: 2.0000 | Freq: Every day | NASAL | Status: DC

## 2012-08-23 MED ORDER — PANTOPRAZOLE SODIUM 40 MG PO TBEC: 40.0000 mg | DELAYED_RELEASE_TABLET | Freq: Every day | ORAL | Status: DC

## 2012-08-23 NOTE — Patient Instructions (Addendum)
Caution with med as you are aware.   Continue lifestyle intervention healthy eating and exercise .  Use protonix as needed ok .  Make sure mammogram,    And other health care  Maintenance  Is up to date.  PAP and pelvic   And med check in 6 months

## 2012-08-23 NOTE — Progress Notes (Signed)
Chief Complaint  Patient presents with  . Follow-up    Ambien refill    HPI: Pt comes in today    Yearly visit for med check .    No major change in health status since last visit .  Allergy  Vs Sinus congsetion  Congested  From travel.   Takes med  flonase for this with help     Does when travels.    GI  ; protonix   taking once or twice a week.     Needs  A few times every week or so.  Life style but currently not taking every day . No blood weight loss vomiting diarrhea etc    Sleep:    Travels  Frequently and across time zones  and depends on [place] a lot.     Taking 5 and then repeat.   If needed   Still travels with work   Over [employer] . At present usually at home no more than 7- 14 days at a timel. Tries to eat healthy and exercise   .      No Amnesia  Other se . Aware of cautions  ROS: See pertinent positives and negatives per HPI.  Past Medical History  Diagnosis Date  . GERD (gastroesophageal reflux disease) 2006    with esophagitis and gi bleed   . Heart murmur   . UTI (lower urinary tract infection)   . Tubal pregnancy 1993  . Breast cyst     aspirated followed with imaging  . Headache     episode diplopia eval neg mri work up  . ANXIETY, SITUATIONAL 04/24/2008    Qualifier: Diagnosis of  By: Fabian Sharp MD, Neta Mends   . Unspecified vitamin D deficiency 11/13/2007    Qualifier: Diagnosis of  By: Fabian Sharp MD, Neta Mends   . DIPLOPIA, HX OF 04/01/2008    Qualifier: Diagnosis of  By: Fabian Sharp MD, Neta Mends   . BREAST MASS 01/28/2010    Qualifier: Diagnosis of  By: Abner Greenspan MD, Misty Stanley      Family History  Problem Relation Age of Onset  . Breast cancer    . Colon cancer    . Diabetes    . Hyperlipidemia    . Hypertension    . Stroke    . Heart disease    . Other      clots grandmother  . Colon cancer Father   . Colon cancer Paternal Grandfather     History   Social History  . Marital Status: Married    Spouse Name: N/A    Number of Children: N/A  . Years  of Education: N/A   Social History Main Topics  . Smoking status: Never Smoker   . Smokeless tobacco: Never Used     Comment: rare  . Alcohol Use: 1.8 oz/week    3 Glasses of wine per week  . Drug Use: No  . Sexually Active: None   Other Topics Concern  . None   Social History Narrative   Married   Regular exercise- no    HH of 2 except college  Age.   Pets cat and dog   Sleep   60 hours week  Travels.    Neg ets .   2-3 etoh per week.    Outpatient Encounter Prescriptions as of 08/23/2012  Medication Sig Dispense Refill  . cholecalciferol (VITAMIN D) 1000 UNITS tablet Take 1,000 Units by mouth daily.      Marland Kitchen  fluticasone (FLONASE) 50 MCG/ACT nasal spray Place 2 sprays into the nose daily.  48 g  3  . Multiple Vitamin (MULTIVITAMIN) tablet Take 1 tablet by mouth daily.      . pantoprazole (PROTONIX) 40 MG tablet Take 1 tablet (40 mg total) by mouth daily.  90 tablet  1  . zolpidem (AMBIEN) 5 MG tablet Take 1 tablet (5 mg total) by mouth at bedtime as needed for sleep.  90 tablet  1  . [DISCONTINUED] fluticasone (FLONASE) 50 MCG/ACT nasal spray Place 2 sprays into the nose daily.  48 g  3  . [DISCONTINUED] pantoprazole (PROTONIX) 40 MG tablet Take 40 mg by mouth daily.      . [DISCONTINUED] zolpidem (AMBIEN) 10 MG tablet Take 1 tablet (10 mg total) by mouth at bedtime as needed for sleep.  30 tablet  0   No facility-administered encounter medications on file as of 08/23/2012.    EXAM:  BP 114/76  Pulse 87  Temp(Src) 98.4 F (36.9 C)  Wt 136 lb (61.689 kg)  BMI 25.71 kg/m2  SpO2 97%  Body mass index is 25.71 kg/(m^2).  GENERAL: vitals reviewed and listed above, alert, oriented, appears well hydrated and in no acute distress  HEENT: atraumatic, conjunctiva  clear, no obvious abnormalities on inspection of external nose and ears OP : no lesion edema or exudate   NECK: no obvious masses on inspection palpation  noadenopathy   LUNGS: clear to auscultation bilaterally,  no wheezes, rales or rhonchi, good air movement  CV: HRRR,  No g or m no clubbing cyanosis or  peripheral edema nl cap refill  Abdomen:  Sof,t normal bowel sounds without hepatosplenomegaly, no guarding rebound or masses no CVA tenderness  MS: moves all extremities without noticeable focal  abnormality  PSYCH: pleasant and cooperative, no obvious depression or anxiety  ASSESSMENT AND PLAN:  Discussed the following assessment and plan:  SYMPTOM, INSOMNIA NOS  travel related  - sleep usually from travel and "jet lag" circadian disruption job related   Medication management  Chronic rhinitis  GERD  Risk benefit of medications discussed. Plan and refill agree not to use protonix qd for now  And follow  Due for pv pap etc   Plan for this in about 6 months  -Patient advised to return or notify health care team  if symptoms worsen or persist or new concerns arise.  Patient Instructions  Caution with med as you are aware.   Continue lifestyle intervention healthy eating and exercise .  Use protonix as needed ok .  Make sure mammogram,    And other health care  Maintenance  Is up to date.  PAP and pelvic   And med check in 6 months      Neta Mends. Panosh M.D. Last pap 2011?  Health Maintenance  Topic Date Due  . Pap Smear  05/27/1973  . Influenza Vaccine  02/12/2013  . Mammogram  08/19/2013  . Tetanus/tdap  10/12/2015  . Colonoscopy  11/10/2016   Health Maintenance Review

## 2012-08-24 ENCOUNTER — Encounter: Payer: Self-pay | Admitting: Internal Medicine

## 2012-10-03 LAB — HM MAMMOGRAPHY

## 2012-10-31 ENCOUNTER — Ambulatory Visit (INDEPENDENT_AMBULATORY_CARE_PROVIDER_SITE_OTHER): Payer: BC Managed Care – PPO | Admitting: Internal Medicine

## 2012-10-31 ENCOUNTER — Encounter: Payer: Self-pay | Admitting: Internal Medicine

## 2012-10-31 VITALS — BP 116/82 | HR 79 | Temp 98.3°F | Wt 140.0 lb

## 2012-10-31 DIAGNOSIS — L659 Nonscarring hair loss, unspecified: Secondary | ICD-10-CM

## 2012-10-31 DIAGNOSIS — R413 Other amnesia: Secondary | ICD-10-CM

## 2012-10-31 DIAGNOSIS — M2550 Pain in unspecified joint: Secondary | ICD-10-CM

## 2012-10-31 LAB — LIPID PANEL
Total CHOL/HDL Ratio: 4
Triglycerides: 103 mg/dL (ref 0.0–149.0)

## 2012-10-31 LAB — HEPATIC FUNCTION PANEL
AST: 15 U/L (ref 0–37)
Alkaline Phosphatase: 63 U/L (ref 39–117)
Bilirubin, Direct: 0 mg/dL (ref 0.0–0.3)

## 2012-10-31 LAB — CBC WITH DIFFERENTIAL/PLATELET
Basophils Absolute: 0 10*3/uL (ref 0.0–0.1)
Basophils Relative: 0.3 % (ref 0.0–3.0)
Eosinophils Absolute: 0.1 10*3/uL (ref 0.0–0.7)
Hemoglobin: 13.9 g/dL (ref 12.0–15.0)
Lymphocytes Relative: 44.5 % (ref 12.0–46.0)
MCHC: 34.4 g/dL (ref 30.0–36.0)
Monocytes Relative: 7.5 % (ref 3.0–12.0)
Neutro Abs: 3 10*3/uL (ref 1.4–7.7)
Neutrophils Relative %: 46.6 % (ref 43.0–77.0)
RBC: 4.78 Mil/uL (ref 3.87–5.11)

## 2012-10-31 LAB — BASIC METABOLIC PANEL
CO2: 28 mEq/L (ref 19–32)
Calcium: 9.4 mg/dL (ref 8.4–10.5)
Creatinine, Ser: 0.7 mg/dL (ref 0.4–1.2)
GFR: 85.85 mL/min (ref >60.00)
Sodium: 137 mEq/L (ref 135–145)

## 2012-10-31 LAB — T3, FREE: T3, Free: 2.9 pg/mL (ref 2.3–4.2)

## 2012-10-31 NOTE — Progress Notes (Signed)
Chief Complaint  Patient presents with  . Hair loss    Ongoing for 3 months.  . Brittle Nails  . Dry Skin  . Weight Gain  . Foggy Memory  . Joint Pain    HPI: Patient comes in today for SDA for  problem evaluation. Multiple sx and concern it could be her thyroid dysfucntion Excessive weight gain and hair and nail changes  And achy joints  And    Weight is yo yoing   Trying to pattern sleep.   Exercise  Walking 4 x per week.  No exercise tolerance.     No change with .   Constipation.  etoh ;     Very little .  Except when travel.s   caffiene chai in am   Sleep   No travel for 3 weeks  And unable to lose weight and gaining  ROS: See pertinent positives and negatives per HPI. No exercise intolerance  Redness odd rashes  Vision change depression   Past Medical History  Diagnosis Date  . GERD (gastroesophageal reflux disease) 2006    with esophagitis and gi bleed   . Heart murmur   . UTI (lower urinary tract infection)   . Tubal pregnancy 1993  . Breast cyst     aspirated followed with imaging  . Headache     episode diplopia eval neg mri work up  . ANXIETY, SITUATIONAL 04/24/2008    Qualifier: Diagnosis of  By: Fabian Sharp MD, Neta Mends   . Unspecified vitamin D deficiency 11/13/2007    Qualifier: Diagnosis of  By: Fabian Sharp MD, Neta Mends   . DIPLOPIA, HX OF 04/01/2008    Qualifier: Diagnosis of  By: Fabian Sharp MD, Neta Mends   . BREAST MASS 01/28/2010    Qualifier: Diagnosis of  By: Abner Greenspan MD, Misty Stanley      Family History  Problem Relation Age of Onset  . Breast cancer    . Colon cancer    . Diabetes    . Hyperlipidemia    . Hypertension    . Stroke    . Heart disease    . Other      clots grandmother  . Colon cancer Father   . Colon cancer Paternal Grandfather     History   Social History  . Marital Status: Married    Spouse Name: N/A    Number of Children: N/A  . Years of Education: N/A   Social History Main Topics  . Smoking status: Never Smoker   . Smokeless tobacco:  Never Used     Comment: rare  . Alcohol Use: 1.8 oz/week    3 Glasses of wine per week  . Drug Use: No  . Sexually Active: None   Other Topics Concern  . None   Social History Narrative   Married   Regular exercise- no    HH of 2 except college  Age.   Pets cat and dog   Sleep   60 hours week  Travels.    Neg ets .   2-3 etoh per week.    Outpatient Encounter Prescriptions as of 10/31/2012  Medication Sig Dispense Refill  . cholecalciferol (VITAMIN D) 1000 UNITS tablet Take 1,000 Units by mouth daily.      . fluticasone (FLONASE) 50 MCG/ACT nasal spray Place 2 sprays into the nose daily.  48 g  3  . pantoprazole (PROTONIX) 40 MG tablet Take 1 tablet (40 mg total) by mouth daily.  90 tablet  1  .  zolpidem (AMBIEN) 5 MG tablet Take 1 tablet (5 mg total) by mouth at bedtime as needed for sleep.  90 tablet  1  . [DISCONTINUED] Multiple Vitamin (MULTIVITAMIN) tablet Take 1 tablet by mouth daily.       No facility-administered encounter medications on file as of 10/31/2012.    EXAM:  BP 116/82  Pulse 79  Temp(Src) 98.3 F (36.8 C) (Oral)  Wt 140 lb (63.504 kg)  BMI 26.47 kg/m2  SpO2 98%  Body mass index is 26.47 kg/(m^2). Wt Readings from Last 3 Encounters:  10/31/12 140 lb (63.504 kg)  08/23/12 136 lb (61.689 kg)  11/11/11 134 lb (60.782 kg)    GENERAL: vitals reviewed and listed above, alert, oriented, appears well hydrated and in no acute distress  HEENT: atraumatic, conjunctiva  clear, no obvious abnormalities on inspection of external nose and ears OP : no lesion edema or exudate   NECK: no obvious masses on inspection palpation  No adenopathy   LUNGS: clear to auscultation bilaterally, no wheezes, rales or rhonchi, good air movement Skin: normal capillary refill ,turgor , color: No acute rashes ,petechiae or bruising Scalp no rash or patchy allopecia  Nl hairline  CV: HRRR, no clubbing cyanosis or  peripheral edema nl cap refill   MS: moves all extremities  without noticeable focal  abnormality  PSYCH: pleasant and cooperative, no obvious depression or anxiety Lab Results  Component Value Date   WBC 6.5 10/31/2012   HGB 13.9 10/31/2012   HCT 40.5 10/31/2012   PLT 263.0 10/31/2012   GLUCOSE 83 10/31/2012   CHOL 223* 10/31/2012   TRIG 103.0 10/31/2012   HDL 53.40 10/31/2012   LDLDIRECT 151.8 10/31/2012   LDLCALC 120* 04/29/2009   ALT 16 10/31/2012   AST 15 10/31/2012   NA 137 10/31/2012   K 4.0 10/31/2012   CL 104 10/31/2012   CREATININE 0.7 10/31/2012   BUN 17 10/31/2012   CO2 28 10/31/2012   TSH 0.98 10/31/2012    ASSESSMENT AND PLAN:  Discussed the following assessment and plan:  HAIR LOSS - seems like age related shedding  r/o thyroid - Plan: Basic metabolic panel, CBC with Differential, Hepatic function panel, TSH, T4, free, T3, free, Sedimentation rate, Lipid panel, ANA  Multiple joint pain - no exam changes - Plan: Basic metabolic panel, CBC with Differential, Hepatic function panel, TSH, T4, free, T3, free, Sedimentation rate, Lipid panel, ANA  Memory difficulties - fogginess doesnt think its the ambien no hx of same before - Plan: Basic metabolic panel, CBC with Differential, Hepatic function panel, TSH, T4, free, T3, free, Sedimentation rate, Lipid panel, ANA Probably age ls meds and post menopausal but certainly metabolic to consider or autoimmune   Her exam is otherwise normal  And noted 6=7 # weight gain in the past year but 4 in past 2 months  -Patient advised to return or notify health care team  if symptoms worsen or persist or new concerns arise.  Patient Instructions  Will notify you  of labs when available. Uncertain cause of your sx    At this time .    Agree we should check  for homonal sx .   Neta Mends. Panosh M.D.

## 2012-10-31 NOTE — Patient Instructions (Signed)
Will notify you  of labs when available. Uncertain cause of your sx    At this time .    Agree we should check  for homonal sx .

## 2012-11-02 LAB — ANTI-NUCLEAR AB-TITER (ANA TITER): ANA Titer 1: NEGATIVE

## 2012-11-02 LAB — ANA: Anti Nuclear Antibody(ANA): POSITIVE — AB

## 2012-11-05 DIAGNOSIS — R413 Other amnesia: Secondary | ICD-10-CM | POA: Insufficient documentation

## 2012-11-05 DIAGNOSIS — M2550 Pain in unspecified joint: Secondary | ICD-10-CM | POA: Insufficient documentation

## 2012-11-14 ENCOUNTER — Telehealth: Payer: Self-pay | Admitting: Family Medicine

## 2012-11-14 NOTE — Progress Notes (Signed)
Quick Note:  Left a message for pt to return call. ______ 

## 2012-11-14 NOTE — Telephone Encounter (Signed)
I spoke to the pt and gave her lab results.  She is unsure what to do next.  Sx do persist.  Would you like to see her back in the office?  She is available the week of the 23rd.  She will be traveling in the mean time.  Please advise.  Thanks!!

## 2012-11-19 NOTE — Telephone Encounter (Signed)
Have her schedule a fu appt for when she is back in town.

## 2012-11-20 NOTE — Telephone Encounter (Signed)
Please scheduled an appt with the pt.  Thanks!!

## 2012-11-20 NOTE — Telephone Encounter (Signed)
lmom for pt to call  °

## 2013-03-06 ENCOUNTER — Encounter: Payer: Self-pay | Admitting: Internal Medicine

## 2013-04-04 ENCOUNTER — Telehealth: Payer: Self-pay | Admitting: Family Medicine

## 2013-04-04 NOTE — Telephone Encounter (Signed)
Last filled on 08/23/12 #90 with 1 additional refills Last seen on 10/31/12.  No future appt. Please advise.  Thanks!

## 2013-04-05 ENCOUNTER — Other Ambulatory Visit: Payer: Self-pay | Admitting: Family Medicine

## 2013-04-05 MED ORDER — ZOLPIDEM TARTRATE 5 MG PO TABS: 5.0000 mg | ORAL_TABLET | Freq: Every evening | ORAL | Status: DC | PRN

## 2013-04-05 NOTE — Telephone Encounter (Signed)
Refill x 1  90 refill before runs out

## 2013-04-05 NOTE — Telephone Encounter (Signed)
Called to the pharmacy and left on voicemail. 

## 2013-05-07 ENCOUNTER — Encounter: Payer: Self-pay | Admitting: Internal Medicine

## 2013-05-08 ENCOUNTER — Ambulatory Visit (INDEPENDENT_AMBULATORY_CARE_PROVIDER_SITE_OTHER): Payer: BC Managed Care – PPO | Admitting: Internal Medicine

## 2013-05-08 ENCOUNTER — Encounter: Payer: Self-pay | Admitting: Internal Medicine

## 2013-05-08 VITALS — BP 116/84 | Temp 97.6°F | Wt 141.0 lb

## 2013-05-08 DIAGNOSIS — K219 Gastro-esophageal reflux disease without esophagitis: Secondary | ICD-10-CM

## 2013-05-08 DIAGNOSIS — G4700 Insomnia, unspecified: Secondary | ICD-10-CM

## 2013-05-08 DIAGNOSIS — R05 Cough: Secondary | ICD-10-CM

## 2013-05-08 MED ORDER — ZOLPIDEM TARTRATE 5 MG PO TABS: 5.0000 mg | ORAL_TABLET | Freq: Every evening | ORAL | Status: DC | PRN

## 2013-05-08 MED ORDER — PANTOPRAZOLE SODIUM 40 MG PO TBEC: 40.0000 mg | DELAYED_RELEASE_TABLET | Freq: Every day | ORAL | Status: DC

## 2013-05-08 NOTE — Patient Instructions (Signed)
Restart the protonix   May help  the cough  Fu if  persistent or progressive cough . Sleep aid  With caution as  You are doing.  OV or preventive visit unb 6 months or there about .

## 2013-05-08 NOTE — Progress Notes (Signed)
Chief Complaint  Patient presents with  . Follow-up    HPI: Fu medications   New problem : dry irritate  Continuing Cough  For a while since august .   Had a sinus infection and seen in urgent care and comes and goes. Off of  Reflux protonix  a few month. Notices her reflux is worse.OTC not that helpful . No ets.  nosathma Sleep takign 5 ambien still traels across many time zones frequently .  Daughter to remain in college more than another year so will continue this  Schedule for a while .   If home for a while may go with out meds.  ROS: See pertinent positives and negatives per HPI. No chest pain shortness of breath Lat pap Dr Chevis Pretty 3 14?    And normal.   Past Medical History  Diagnosis Date  . GERD (gastroesophageal reflux disease) 2006    with esophagitis and gi bleed   . Heart murmur   . UTI (lower urinary tract infection)   . Tubal pregnancy 1993  . Breast cyst     aspirated followed with imaging  . Headache(784.0)     episode diplopia eval neg mri work up  . ANXIETY, SITUATIONAL 04/24/2008    Qualifier: Diagnosis of  By: Fabian Sharp MD, Neta Mends   . Unspecified vitamin D deficiency 11/13/2007    Qualifier: Diagnosis of  By: Fabian Sharp MD, Neta Mends   . DIPLOPIA, HX OF 04/01/2008    Qualifier: Diagnosis of  By: Fabian Sharp MD, Neta Mends   . BREAST MASS 01/28/2010    Qualifier: Diagnosis of  By: Abner Greenspan MD, Misty Stanley      Family History  Problem Relation Age of Onset  . Breast cancer    . Colon cancer    . Diabetes    . Hyperlipidemia    . Hypertension    . Stroke    . Heart disease    . Other      clots grandmother  . Colon cancer Father   . Colon cancer Paternal Grandfather     History   Social History  . Marital Status: Married    Spouse Name: N/A    Number of Children: N/A  . Years of Education: N/A   Social History Main Topics  . Smoking status: Never Smoker   . Smokeless tobacco: Never Used     Comment: rare  . Alcohol Use: 1.8 oz/week    3 Glasses of wine per week  .  Drug Use: No  . Sexual Activity: None   Other Topics Concern  . None   Social History Narrative   Married   Regular exercise- no    HH of 2 except college  Age.   Pets cat and dog   Sleep   60 hours week  Travels.    Neg ets .   2-3 etoh per week.    Outpatient Encounter Prescriptions as of 05/08/2013  Medication Sig  . cholecalciferol (VITAMIN D) 1000 UNITS tablet Take 1,000 Units by mouth daily.  . fluticasone (FLONASE) 50 MCG/ACT nasal spray Place 2 sprays into the nose daily.  Marland Kitchen zolpidem (AMBIEN) 5 MG tablet Take 1 tablet (5 mg total) by mouth at bedtime as needed for sleep.  . [DISCONTINUED] zolpidem (AMBIEN) 5 MG tablet Take 1 tablet (5 mg total) by mouth at bedtime as needed for sleep.  . pantoprazole (PROTONIX) 40 MG tablet Take 1 tablet (40 mg total) by mouth daily.  . [DISCONTINUED] pantoprazole (  PROTONIX) 40 MG tablet Take 1 tablet (40 mg total) by mouth daily.    EXAM:  BP 116/84  Temp(Src) 97.6 F (36.4 C) (Oral)  Wt 141 lb (63.957 kg)  Body mass index is 26.66 kg/(m^2).  GENERAL: vitals reviewed and listed above, alert, oriented, appears well hydrated and in no acute distress HEENT: atraumatic, conjunctiva  clear, no obvious abnormalities on inspection of external nose and ears OP : no lesion edema or exudate NECK: no obvious masses on inspection palpation  LUNGS: clear to auscultation bilaterally, no wheezes, rales or rhonchi, good air movement CV: HRRR, no clubbing cyanosis or  peripheral edema nl cap refill  MS: moves all extremities without noticeable focal  abnormality Abdomen:  Sof,t normal bowel sounds without hepatosplenomegaly, no guarding rebound or masses no CVA tenderness PSYCH: pleasant and cooperative, no obvious depression or anxiety Lab Results  Component Value Date   WBC 6.5 10/31/2012   HGB 13.9 10/31/2012   HCT 40.5 10/31/2012   PLT 263.0 10/31/2012   GLUCOSE 83 10/31/2012   CHOL 223* 10/31/2012   TRIG 103.0 10/31/2012   HDL 53.40  10/31/2012   LDLDIRECT 151.8 10/31/2012   LDLCALC 120* 04/29/2009   ALT 16 10/31/2012   AST 15 10/31/2012   NA 137 10/31/2012   K 4.0 10/31/2012   CL 104 10/31/2012   CREATININE 0.7 10/31/2012   BUN 17 10/31/2012   CO2 28 10/31/2012   TSH 0.98 10/31/2012    ASSESSMENT AND PLAN:  Discussed the following assessment and plan:  Cough, persistent - ? post infectious poss reflux bu hx  nl exam   Esophageal reflux - sx off prot and otc not that helpful reinstitute  PPi   Insomnia, unspecified - related to frequent travela nd time zones better when able to be at home benefit  mroe than risk can refill 6 months  -Patient advised to return or notify health care team  if symptoms worsen or persist or new concerns arise.  Patient Instructions  Restart the protonix   May help  the cough  Fu if  persistent or progressive cough . Sleep aid  With caution as  You are doing.  OV or preventive visit unb 6 months or there about .    Neta Mends. Panosh M.D.  Pre visit review using our clinic review tool, if applicable. No additional management support is needed unless otherwise documented below in the visit note.

## 2013-11-16 ENCOUNTER — Encounter: Payer: Self-pay | Admitting: Internal Medicine

## 2013-11-16 ENCOUNTER — Ambulatory Visit (INDEPENDENT_AMBULATORY_CARE_PROVIDER_SITE_OTHER): Payer: BC Managed Care – PPO | Admitting: Internal Medicine

## 2013-11-16 VITALS — BP 114/74 | Temp 98.0°F | Ht 61.0 in | Wt 141.0 lb

## 2013-11-16 DIAGNOSIS — Z79899 Other long term (current) drug therapy: Secondary | ICD-10-CM

## 2013-11-16 DIAGNOSIS — K219 Gastro-esophageal reflux disease without esophagitis: Secondary | ICD-10-CM

## 2013-11-16 DIAGNOSIS — R3129 Other microscopic hematuria: Secondary | ICD-10-CM

## 2013-11-16 DIAGNOSIS — J310 Chronic rhinitis: Secondary | ICD-10-CM

## 2013-11-16 DIAGNOSIS — G4700 Insomnia, unspecified: Secondary | ICD-10-CM

## 2013-11-16 MED ORDER — ZOLPIDEM TARTRATE 5 MG PO TABS: 5.0000 mg | ORAL_TABLET | Freq: Every evening | ORAL | Status: DC | PRN

## 2013-11-16 MED ORDER — PANTOPRAZOLE SODIUM 40 MG PO TBEC: 40.0000 mg | DELAYED_RELEASE_TABLET | Freq: Every day | ORAL | Status: DC

## 2013-11-16 MED ORDER — FLUTICASONE PROPIONATE 50 MCG/ACT NA SUSP: 2.0000 | Freq: Every day | NASAL | Status: DC

## 2013-11-16 NOTE — Progress Notes (Signed)
Chief Complaint  Patient presents with  . Follow-up    medications     HPI: Pt comesin for fu of  Medication and condition evaluation  mchornic sleep insomina issues esp related to travel with her job. Is taking 5 mg ambien .  Rarely dont  Use . No se  Denies alarm SE reviewed  GERD: protonix  Not always daily a few times a week. and dietary.  Flares peanut butter  Cheese straws.  Coffee  On emepty stomach.   Rhinitis  flonase when needed   and vit d every day .  Taking care of 23 month old grandchild  For a few months and somewhat tiring but doing better  Health Maintenance  Topic Date Due  . Influenza Vaccine  01/12/2014  . Tetanus/tdap  10/12/2015  . Mammogram  11/14/2015  . Colonoscopy  11/10/2016  . Pap Smear  11/13/2016   Health Maintenance Review [place] ROS: See pertinent positives and negatives per HPI.  Dr mexer  Found micro hematuria and going to urologist to eval but has no sx at this time No depressino anxiety cv pulm sx injury  Past Medical History  Diagnosis Date  . GERD (gastroesophageal reflux disease) 2006    with esophagitis and gi bleed   . Heart murmur   . UTI (lower urinary tract infection)   . Tubal pregnancy 1993  . Breast cyst     aspirated followed with imaging  . Headache(784.0)     episode diplopia eval neg mri work up  . ANXIETY, SITUATIONAL 04/24/2008    Qualifier: Diagnosis of  By: Regis Bill MD, Standley Brooking   . Unspecified vitamin D deficiency 11/13/2007    Qualifier: Diagnosis of  By: Regis Bill MD, Standley Brooking   . DIPLOPIA, HX OF 04/01/2008    Qualifier: Diagnosis of  By: Regis Bill MD, Standley Brooking   . BREAST MASS 01/28/2010    Qualifier: Diagnosis of  By: Charlett Blake MD, Erline Levine      Family History  Problem Relation Age of Onset  . Breast cancer    . Colon cancer    . Diabetes    . Hyperlipidemia    . Hypertension    . Stroke    . Heart disease    . Other      clots grandmother  . Colon cancer Father   . Colon cancer Paternal Grandfather      History   Social History  . Marital Status: Married    Spouse Name: N/A    Number of Children: N/A  . Years of Education: N/A   Social History Main Topics  . Smoking status: Never Smoker   . Smokeless tobacco: Never Used     Comment: rare  . Alcohol Use: 1.8 oz/week    3 Glasses of wine per week  . Drug Use: No  . Sexual Activity: None   Other Topics Concern  . None   Social History Narrative   Married   Regular exercise- no    HH of 2 except college  Age.   Pets cat and dog   Sleep   60 hours week  Travels.    Neg ets .   2-3 etoh per week.    Outpatient Encounter Prescriptions as of 11/16/2013  Medication Sig  . cholecalciferol (VITAMIN D) 1000 UNITS tablet Take 1,000 Units by mouth daily.  . fluticasone (FLONASE) 50 MCG/ACT nasal spray Place 2 sprays into both nostrils daily.  . pantoprazole (PROTONIX) 40 MG tablet  Take 1 tablet (40 mg total) by mouth daily.  Marland Kitchen zolpidem (AMBIEN) 5 MG tablet Take 1 tablet (5 mg total) by mouth at bedtime as needed for sleep.  . [DISCONTINUED] fluticasone (FLONASE) 50 MCG/ACT nasal spray Place 2 sprays into the nose daily.  . [DISCONTINUED] fluticasone (FLONASE) 50 MCG/ACT nasal spray Place 2 sprays into the nose daily as needed.  . [DISCONTINUED] pantoprazole (PROTONIX) 40 MG tablet Take 1 tablet (40 mg total) by mouth daily.  . [DISCONTINUED] zolpidem (AMBIEN) 5 MG tablet Take 1 tablet (5 mg total) by mouth at bedtime as needed for sleep.    EXAM:  BP 114/74  Temp(Src) 98 F (36.7 C) (Oral)  Ht 5\' 1"  (1.549 m)  Wt 141 lb (63.957 kg)  BMI 26.66 kg/m2  Body mass index is 26.66 kg/(m^2).  GENERAL: vitals reviewed and listed above, alert, oriented, appears well hydrated and in no acute distress HEENT: atraumatic, conjunctiva  clear, no obvious abnormalities on inspection of external nose and ears OP : no lesion edema or exudate  NECK: no obvious masses on inspection palpation  LUNGS: clear to auscultation bilaterally, no  wheezes, rales or rhonchi, good air movement CV: HRRR, no clubbing cyanosis or  peripheral edema nl cap refill  MS: moves all extremities without noticeable focal  abnormality PSYCH: pleasant and cooperative, no obvious depression or anxiety Lab Results  Component Value Date   WBC 6.5 10/31/2012   HGB 13.9 10/31/2012   HCT 40.5 10/31/2012   PLT 263.0 10/31/2012   GLUCOSE 83 10/31/2012   CHOL 223* 10/31/2012   TRIG 103.0 10/31/2012   HDL 53.40 10/31/2012   LDLDIRECT 151.8 10/31/2012   LDLCALC 120* 04/29/2009   ALT 16 10/31/2012   AST 15 10/31/2012   NA 137 10/31/2012   K 4.0 10/31/2012   CL 104 10/31/2012   CREATININE 0.7 10/31/2012   BUN 17 10/31/2012   CO2 28 10/31/2012   TSH 0.98 10/31/2012    ASSESSMENT AND PLAN:  Discussed the following assessment and plan:  SYMPTOM, INSOMNIA NOS - situation al and cchronid at this time benefot more than risk  continue with cautions  Chronic rhinitis - cont INCS  Esophageal reflux - cont med and ls taking every other dayor about  Medication management  Microscopic hematuria - under eval to see urologist  -Patient advised to return or notify health care team  if symptoms worsen ,persist or new concerns arise.  Patient Instructions  Continue lifestyle intervention healthy eating and exercise . ROV  6 months or so for med check or as needed    Standley Brooking. Nikeshia Keetch M.D.  Pre visit review using our clinic review tool, if applicable. No additional management support is needed unless otherwise documented below in the visit note.

## 2013-11-16 NOTE — Patient Instructions (Signed)
Continue lifestyle intervention healthy eating and exercise . ROV  6 months or so for med check or as needed

## 2014-04-15 ENCOUNTER — Encounter: Payer: Self-pay | Admitting: Internal Medicine

## 2014-05-20 ENCOUNTER — Telehealth: Payer: Self-pay | Admitting: Internal Medicine

## 2014-05-20 NOTE — Telephone Encounter (Signed)
LM for the pt to return my call. 

## 2014-05-20 NOTE — Telephone Encounter (Signed)
Pt would like to know if she is due for pneumonia shot and tdap (pt will have 2 grandbabies next year) and will needs refill on [place] next year call [facility]

## 2014-05-20 NOTE — Telephone Encounter (Signed)
Patient has not had a Tdap.  Had a regular td in 2007.  Please help the pt to get on the schedule.  Thanks!

## 2014-05-21 NOTE — Telephone Encounter (Signed)
Spoke to the pt.  She has an appt on 06/20/14.  She will have tdap at that time.  Will also get the prevnar if approved by [facility].

## 2014-06-03 ENCOUNTER — Other Ambulatory Visit: Payer: Self-pay | Admitting: Gynecology

## 2014-06-04 LAB — CYTOLOGY - PAP

## 2014-06-14 ENCOUNTER — Emergency Department (INDEPENDENT_AMBULATORY_CARE_PROVIDER_SITE_OTHER)
Admission: EM | Admit: 2014-06-14 | Discharge: 2014-06-14 | Disposition: A | Discharge status: Home / Self Care | Payer: BC Managed Care – PPO | Source: Home / Self Care | Attending: Family Medicine | Admitting: Family Medicine

## 2014-06-14 ENCOUNTER — Ambulatory Visit (HOSPITAL_COMMUNITY): Payer: BC Managed Care – PPO | Attending: Emergency Medicine

## 2014-06-14 ENCOUNTER — Encounter (HOSPITAL_COMMUNITY): Payer: Self-pay | Admitting: Emergency Medicine

## 2014-06-14 DIAGNOSIS — R059 Cough, unspecified: Secondary | ICD-10-CM

## 2014-06-14 DIAGNOSIS — R509 Fever, unspecified: Secondary | ICD-10-CM

## 2014-06-14 DIAGNOSIS — J40 Bronchitis, not specified as acute or chronic: Secondary | ICD-10-CM | POA: Insufficient documentation

## 2014-06-14 DIAGNOSIS — J9801 Acute bronchospasm: Secondary | ICD-10-CM

## 2014-06-14 DIAGNOSIS — R0600 Dyspnea, unspecified: Secondary | ICD-10-CM

## 2014-06-14 DIAGNOSIS — R05 Cough: Secondary | ICD-10-CM | POA: Diagnosis not present

## 2014-06-14 MED ORDER — ALBUTEROL SULFATE (2.5 MG/3ML) 0.083% IN NEBU
2.5000 mg | INHALATION_SOLUTION | Freq: Once | RESPIRATORY_TRACT | Status: AC
  Administered 2014-06-14: 2.5 mg via RESPIRATORY_TRACT

## 2014-06-14 MED ORDER — IPRATROPIUM-ALBUTEROL 0.5-2.5 (3) MG/3ML IN SOLN
RESPIRATORY_TRACT | Status: AC
  Filled 2014-06-14: qty 3

## 2014-06-14 MED ORDER — ALBUTEROL SULFATE HFA 108 (90 BASE) MCG/ACT IN AERS: 2.0000 | INHALATION_SPRAY | RESPIRATORY_TRACT | Status: DC | PRN

## 2014-06-14 MED ORDER — ALBUTEROL SULFATE (2.5 MG/3ML) 0.083% IN NEBU
INHALATION_SOLUTION | RESPIRATORY_TRACT | Status: AC
  Filled 2014-06-14: qty 3

## 2014-06-14 MED ORDER — IPRATROPIUM-ALBUTEROL 0.5-2.5 (3) MG/3ML IN SOLN
3.0000 mL | Freq: Once | RESPIRATORY_TRACT | Status: AC
  Administered 2014-06-14: 3 mL via RESPIRATORY_TRACT

## 2014-06-14 MED ORDER — BECLOMETHASONE DIPROPIONATE 80 MCG/ACT IN AERS: 1.0000 | INHALATION_SPRAY | Freq: Two times a day (BID) | RESPIRATORY_TRACT | Status: DC

## 2014-06-14 NOTE — Discharge Instructions (Signed)
Bronchospasm A bronchospasm is when the tubes that carry air in and out of your lungs (airways) spasm or tighten. During a bronchospasm it is hard to breathe. This is because the airways get smaller. A bronchospasm can be triggered by: 1. Allergies. These may be to animals, pollen, food, or mold. 2. Infection. This is a common cause of bronchospasm. 3. Exercise. 4. Irritants. These include pollution, cigarette smoke, strong odors, aerosol sprays, and paint fumes. 5. Weather changes. 6. Stress. 7. Being emotional. HOME CARE   Always have a plan for getting help. Know when to call your doctor and local emergency services (911 in the [place] Know where you can get emergency care.  Only take medicines as told by your doctor.  If you were prescribed an inhaler or nebulizer machine, ask your doctor how to use it correctly. Always use a spacer with your inhaler if you were given one.  Stay calm during an attack. Try to relax and breathe more slowly.  Control your home environment:  Change your heating and air conditioning filter at least once a month.  Limit your use of fireplaces and wood stoves.  Do not  smoke. Do not  allow smoking in your home.  Avoid perfumes and fragrances.  Get rid of pests (such as roaches and mice) and their droppings.  Throw away plants if you see mold on them.  Keep your house clean and dust free.  Replace carpet with wood, tile, or vinyl flooring. Carpet can trap dander and dust.  Use allergy-proof pillows, mattress covers, and box spring covers.  Wash bed sheets and blankets every week in hot water. Dry them in a dryer.  Use blankets that are made of polyester or cotton.  Wash hands frequently. GET HELP IF:  You have muscle aches.  You have chest pain.  The thick spit you spit or cough up (sputum) changes from clear or white to yellow, green, gray, or bloody.  The thick spit you spit or cough up gets thicker.  There are problems that may be  related to the medicine you are given such as:  A rash.  Itching.  Swelling.  Trouble breathing. GET HELP RIGHT AWAY IF:  You feel you cannot breathe or catch your breath.  You cannot stop coughing.  Your treatment is not helping you breathe better.  You have very bad chest pain. MAKE SURE YOU:   Understand these instructions.  Will watch your condition.  Will get help right away if you are not doing well or get worse. Document Released: 03/28/2009 Document Revised: 06/05/2013 Document Reviewed: 11/21/2012 [facility] Patient Information 2015 [place], [place]. This information is not intended to replace advice given to you by your health care provider. Make sure you discuss any questions you have with your health care provider.  How to Use an Inhaler Using your inhaler correctly is very important. Good technique will make sure that the medicine reaches your lungs.  HOW TO USE AN INHALER: 8. Take the cap off the inhaler. 9. If this is the first time using your inhaler, you need to prime it. Shake the inhaler for 5 seconds. Release four puffs into the air, away from your face. Ask your doctor for help if you have questions. 10. Shake the inhaler for 5 seconds. 11. Turn the inhaler so the bottle is above the mouthpiece. 12. Put your pointer finger on top of the bottle. Your thumb holds the bottom of the inhaler. 13. Open your mouth. 14. Either hold  the inhaler away from your mouth (the width of 2 fingers) or place your lips tightly around the mouthpiece. Ask your doctor which way to use your inhaler. 15. Breathe out as much air as possible. 16. Breathe in and push down on the bottle 1 time to release the medicine. You will feel the medicine go in your mouth and throat. 17. Continue to take a deep breath in very slowly. Try to fill your lungs. 18. After you have breathed in completely, hold your breath for 10 seconds. This will help the medicine to settle in your lungs. If you cannot  hold your breath for 10 seconds, hold it for as long as you can before you breathe out. 19. Breathe out slowly, through pursed lips. Whistling is an example of pursed lips. 20. If your doctor has told you to take more than 1 puff, wait at least 15-30 seconds between puffs. This will help you get the best results from your medicine. Do not use the inhaler more than your doctor tells you to. 21. Put the cap back on the inhaler. 22. Follow the directions from your doctor or from the inhaler package about cleaning the inhaler. If you use more than one inhaler, ask your doctor which inhalers to use and what order to use them in. Ask your doctor to help you figure out when you will need to refill your inhaler.  If you use a steroid inhaler, always rinse your mouth with water after your last puff, gargle and spit out the water. Do not swallow the water. GET HELP IF:  The inhaler medicine only partially helps to stop wheezing or shortness of breath.  You are having trouble using your inhaler.  You have some increase in thick spit (phlegm). GET HELP RIGHT AWAY IF:  The inhaler medicine does not help your wheezing or shortness of breath or you have tightness in your chest.  You have dizziness, headaches, or fast heart rate.  You have chills, fever, or night sweats.  You have a large increase of thick spit, or your thick spit is bloody. MAKE SURE YOU:   Understand these instructions.  Will watch your condition.  Will get help right away if you are not doing well or get worse. Document Released: 03/09/2008 Document Revised: 03/21/2013 Document Reviewed: 12/28/2012 [facility] Patient Information 2015 [place], [place]. This information is not intended to replace advice given to you by your health care provider. Make sure you discuss any questions you have with your health care provider.

## 2014-06-14 NOTE — ED Notes (Signed)
Cough for a month, pcp provided antibiotic injection and steroid injection and was thought to be improving.  Over the last few days has felt worse.  Continued cough and feeling tired

## 2014-06-14 NOTE — ED Notes (Signed)
Patient transferred by shuttle to [facility]. Janne Napoleon pa requested medicine wait until patient returns from radiology

## 2014-06-14 NOTE — ED Provider Notes (Signed)
CSN: [id number]     Arrival date & time 06/14/14  1453 History   First MD Initiated Contact with Patient 06/14/14 1501     Chief Complaint  Patient presents with  . Cough   (Consider location/radiation/quality/duration/timing/severity/associated sxs/prior Treatment) HPI Comments: 59 year old female complaining of a cough intermittently for one month. On 05/16/2014 she saw her PCP and diagnosed with antibiotics and administered an antibiotic injection and a corticosteroid injection. She felt better for a few days until approximately one week ago when her cough returned. She is complaining of a dry tacky and persistent cough, feels weak, has had fevers of 99.8 at home and shortness of breath. She denies any known cardiopulmonary history. No history of asthma or smoking.   Past Medical History  Diagnosis Date  . GERD (gastroesophageal reflux disease) 2006    with esophagitis and gi bleed   . Heart murmur   . UTI (lower urinary tract infection)   . Tubal pregnancy 1993  . Breast cyst     aspirated followed with imaging  . Headache(784.0)     episode diplopia eval neg mri work up  . ANXIETY, SITUATIONAL 04/24/2008    Qualifier: Diagnosis of  By: Regis Bill MD, Standley Brooking   . Unspecified vitamin D deficiency 11/13/2007    Qualifier: Diagnosis of  By: Regis Bill MD, Standley Brooking   . DIPLOPIA, HX OF 04/01/2008    Qualifier: Diagnosis of  By: Regis Bill MD, Standley Brooking   . BREAST MASS 01/28/2010    Qualifier: Diagnosis of  By: Charlett Blake MD, Erline Levine     Past Surgical History  Procedure Laterality Date  . Breast enhancement surgery    . Dilation and curettage of uterus      x 2  . Tubal ligation    . Breast cyst aspiration    . Tonsillectomy    . Colonoscopy    . Upper gastrointestinal endoscopy     Family History  Problem Relation Age of Onset  . Breast cancer    . Colon cancer    . Diabetes    . Hyperlipidemia    . Hypertension    . Stroke    . Heart disease    . Other      clots grandmother  . Colon  cancer Father   . Colon cancer Paternal Grandfather    History  Substance Use Topics  . Smoking status: Never Smoker   . Smokeless tobacco: Never Used     Comment: rare  . Alcohol Use: 1.8 oz/week    3 Glasses of wine per week   OB History    Gravida Para Term Preterm AB TAB SAB Ectopic Multiple Living   3 2             Review of Systems  Constitutional: Positive for fever and activity change.  HENT: Positive for postnasal drip.   Eyes: Negative.   Respiratory: Positive for cough and shortness of breath.   Cardiovascular: Negative.   Skin: Negative.     Allergies  Review of patient's allergies indicates no known allergies.  Home Medications   Prior to Admission medications   Medication Sig Start Date End Date Taking? Authorizing Provider  albuterol (PROVENTIL HFA;VENTOLIN HFA) 108 (90 BASE) MCG/ACT inhaler Inhale 2 puffs into the lungs every 4 (four) hours as needed for wheezing or shortness of breath. 06/14/14   Janne Napoleon, NP  beclomethasone (QVAR) 80 MCG/ACT inhaler Inhale 1 puff into the lungs 2 (two) times daily. 06/14/14  Janne Napoleon, NP  cholecalciferol (VITAMIN D) 1000 UNITS tablet Take 1,000 Units by mouth daily.    Historical Provider, MD  fluticasone (FLONASE) 50 MCG/ACT nasal spray Place 2 sprays into both nostrils daily. 11/16/13   Burnis Medin, MD  pantoprazole (PROTONIX) 40 MG tablet Take 1 tablet (40 mg total) by mouth daily. 11/16/13   Burnis Medin, MD  zolpidem (AMBIEN) 5 MG tablet Take 1 tablet (5 mg total) by mouth at bedtime as needed for sleep. 11/16/13   Burnis Medin, MD   BP 125/85 mmHg  Pulse 95  Temp(Src) 97.8 F (36.6 C) (Oral)  Resp 17  SpO2 94% Physical Exam  Constitutional: She is oriented to person, place, and time. She appears well-developed and well-nourished. No distress.  HENT:  Right Ear: External ear normal.  Left Ear: External ear normal.  Mouth/Throat: No oropharyngeal exudate.  OP with mild amount of clear PND and minor injection.   Eyes: Conjunctivae and EOM are normal.  Neck: Normal range of motion. Neck supple.  Cardiovascular: Normal rate, regular rhythm and normal heart sounds.   Pulmonary/Chest: Effort normal.  Mildly prolonged expiratory phase. Patient unable to take a deep breath without having coughing spasms. Coarseness and wheezing associated with cough. Initial exam reveals equivocal crackles to the right lower lung field.  Lymphadenopathy:    She has no cervical adenopathy.  Neurological: She is alert and oriented to person, place, and time.  Skin: Skin is warm and dry.  Psychiatric: She has a normal mood and affect.  Nursing note and vitals reviewed.   ED Course  Procedures (including critical care time) Labs Review Labs Reviewed - No data to display  Imaging Review Dg Chest 2 View  06/14/2014   CLINICAL DATA:  Low grade fever.  Bronchitis.  Cough for 1 month.  EXAM: CHEST  2 VIEW  COMPARISON:  03/26/2008  FINDINGS: Midline trachea. Normal heart size and mediastinal contours. No pleural effusion or pneumothorax. Clear lungs. Artifact from clothing or hair projects over the lung apices minimally.  IMPRESSION: No acute cardiopulmonary disease.   Electronically Signed   By: Abigail Miyamoto M.D.   On: 06/14/2014 15:48     MDM   1. Bronchospasm   2. Cough   3. Fever   4. Dyspnea     Much improved post DuoNeb 5 mg. Less coughing episodes that if he breath without coughing spasms. Lungs are now clear, no wheezing. Albuterol HFA 2 puffs every 4 hours when necessary Qvar 80 mg twice a day Claritin or Allegra for PND Follow-up with pH CP as needed for any worsening new symptoms or problems may return.     Janne Napoleon, NP 06/14/14 [phone]

## 2014-06-20 ENCOUNTER — Encounter: Payer: Self-pay | Admitting: Internal Medicine

## 2014-06-20 ENCOUNTER — Ambulatory Visit (INDEPENDENT_AMBULATORY_CARE_PROVIDER_SITE_OTHER): Payer: BLUE CROSS/BLUE SHIELD | Admitting: Internal Medicine

## 2014-06-20 VITALS — BP 114/70 | Temp 98.5°F | Ht 61.0 in | Wt 142.2 lb

## 2014-06-20 DIAGNOSIS — Z79899 Other long term (current) drug therapy: Secondary | ICD-10-CM

## 2014-06-20 DIAGNOSIS — R053 Chronic cough: Secondary | ICD-10-CM | POA: Insufficient documentation

## 2014-06-20 DIAGNOSIS — G4700 Insomnia, unspecified: Secondary | ICD-10-CM

## 2014-06-20 DIAGNOSIS — R05 Cough: Secondary | ICD-10-CM

## 2014-06-20 HISTORY — DX: Chronic cough: R05.3

## 2014-06-20 MED ORDER — ZOLPIDEM TARTRATE 5 MG PO TABS: 5.0000 mg | ORAL_TABLET | Freq: Every evening | ORAL | Status: DC | PRN

## 2014-06-20 MED ORDER — BENZONATATE 100 MG PO CAPS: 100.0000 mg | ORAL_CAPSULE | Freq: Three times a day (TID) | ORAL | Status: DC | PRN

## 2014-06-20 NOTE — Progress Notes (Signed)
Pre visit review using our clinic review tool, if applicable. No additional management support is needed unless otherwise documented below in the visit note.  Chief Complaint  Patient presents with  . Follow-up    meds and cough     HPI: Marcia Irwin 59 y.o. fu  Sleep still using ambien 5 per day with travel  Urgent care seen after trip form [place] with sneezxing and cough dx with  pna based on exam and go better given shot of steroid and shot antibiotica nd10  D of antibiotic   But then persistent cough off an on for a month  Husband had pna  [facility] for bronchospasm  q var bid and alb q 4 prn .? If helps no fever cp sob today intermittent spasm cough  Using cough drops .  Tried bed t ime menthol neb and seems to help  ambien  5 per day   GERD ok   Taking once a week.doesnt think contributing  Asks about prevnar  ROS: See pertinent positives and negatives per HPI.  Past Medical History  Diagnosis Date  . GERD (gastroesophageal reflux disease) 2006    with esophagitis and gi bleed   . Heart murmur   . UTI (lower urinary tract infection)   . Tubal pregnancy 1993  . Breast cyst     aspirated followed with imaging  . Headache(784.0)     episode diplopia eval neg mri work up  . ANXIETY, SITUATIONAL 04/24/2008    Qualifier: Diagnosis of  By: Regis Bill MD, Standley Brooking   . Unspecified vitamin D deficiency 11/13/2007    Qualifier: Diagnosis of  By: Regis Bill MD, Standley Brooking   . DIPLOPIA, HX OF 04/01/2008    Qualifier: Diagnosis of  By: Regis Bill MD, Standley Brooking   . BREAST MASS 01/28/2010    Qualifier: Diagnosis of  By: Charlett Blake MD, Erline Levine      Family History  Problem Relation Age of Onset  . Breast cancer    . Colon cancer    . Diabetes    . Hyperlipidemia    . Hypertension    . Stroke    . Heart disease    . Other      clots grandmother  . Colon cancer Father   . Colon cancer Paternal Grandfather     History   Social History  . Marital Status: Married    Spouse Name: N/A    Number  of Children: N/A  . Years of Education: N/A   Social History Main Topics  . Smoking status: Never Smoker   . Smokeless tobacco: Never Used     Comment: rare  . Alcohol Use: 1.8 oz/week    3 Glasses of wine per week  . Drug Use: No  . Sexual Activity: Not on file   Other Topics Concern  . Not on file   Social History Narrative   Married   Regular exercise- no    HH of 2 except college  Age.   Pets cat and dog   Sleep   60 hours week  Travels.    Neg ets .   2-3 etoh per week.    Outpatient Encounter Prescriptions as of 06/20/2014  Medication Sig  . albuterol (PROVENTIL HFA;VENTOLIN HFA) 108 (90 BASE) MCG/ACT inhaler Inhale 2 puffs into the lungs every 4 (four) hours as needed for wheezing or shortness of breath.  . beclomethasone (QVAR) 80 MCG/ACT inhaler Inhale 1 puff into the lungs 2 (two) times  daily.  . fluticasone (FLONASE) 50 MCG/ACT nasal spray Place 2 sprays into both nostrils daily.  . pantoprazole (PROTONIX) 40 MG tablet Take 1 tablet (40 mg total) by mouth daily.  Marland Kitchen zolpidem (AMBIEN) 5 MG tablet Take 1 tablet (5 mg total) by mouth at bedtime as needed for sleep.  . [DISCONTINUED] zolpidem (AMBIEN) 5 MG tablet Take 1 tablet (5 mg total) by mouth at bedtime as needed for sleep.  . benzonatate (TESSALON) 100 MG capsule Take 1-2 capsules (100-200 mg total) by mouth 3 (three) times daily as needed for cough.  . [DISCONTINUED] cholecalciferol (VITAMIN D) 1000 UNITS tablet Take 1,000 Units by mouth daily.    EXAM:  BP 114/70 mmHg  Temp(Src) 98.5 F (36.9 C) (Oral)  Ht 5\' 1"  (1.549 m)  Wt 142 lb 3.2 oz (64.501 kg)  BMI 26.88 kg/m2  Body mass index is 26.88 kg/(m^2).  GENERAL: vitals reviewed and listed above, alert, oriented, appears well hydrated and in no acute distress ocass spasm of cough  Not deep  HEENT: atraumatic, conjunctiva  clear, no obvious abnormalities on inspection of external nose and earstms clear  OP : no lesion edema or exudate  NECK: no obvious  masses on inspection palpation  LUNGS: clear to auscultation bilaterally, no wheezes, rales or rhonchi, good air movement CV: HRRR, no clubbing cyanosis or  peripheral edema nl cap refill  MS: moves all extremities without noticeable focal  abnormality PSYCH: pleasant and cooperative, no obvious depression or anxiety  ASSESSMENT AND PLAN:  Discussed the following assessment and plan:  Medication management  Cough, persistent - rx for bronchospasm pos pist infectious  dec airway irratilbity optimize flonase and   ppi for now  and fu if needed no hx of asthma nl expam excep dry cough sp  Insomnia - benefit more than isk at this time Reviewed data      Expectant management. And meds  And fu if     persistent or progressive    insurance prob not pay for prevnar  13 indications discussed  Can revisit . At next visit .  reveiwed instructions below  -Patient advised to return or notify health care team  if symptoms worsen ,persist or new concerns arise.  Patient Instructions  dont use cough drops  Sugar  Free candy .  Hot tea and honey as good as some cough meds .  Continue controller med   q var but albuterol as needed . Take flonase   Every day for now.  Go back on every day protonix for at least 2 weeks.   Continued caution with sleep aids .benefit more than riska t this this time  maintain healthy lifestyle   Standley Brooking. Panosh M.D.

## 2014-06-20 NOTE — Patient Instructions (Addendum)
dont use cough drops  Sugar  Free candy .  Hot tea and honey as good as some cough meds .  Continue controller med   q var but albuterol as needed . Take flonase   Every day for now.  Go back on every day protonix for at least 2 weeks.   Continued caution with sleep aids .benefit more than riska t this this time  maintain healthy lifestyle

## 2014-07-08 ENCOUNTER — Ambulatory Visit (INDEPENDENT_AMBULATORY_CARE_PROVIDER_SITE_OTHER): Payer: BLUE CROSS/BLUE SHIELD | Admitting: Internal Medicine

## 2014-07-08 ENCOUNTER — Encounter: Payer: Self-pay | Admitting: Internal Medicine

## 2014-07-08 VITALS — BP 114/82 | Temp 98.2°F | Ht 61.0 in | Wt 145.1 lb

## 2014-07-08 DIAGNOSIS — R05 Cough: Secondary | ICD-10-CM

## 2014-07-08 DIAGNOSIS — J069 Acute upper respiratory infection, unspecified: Secondary | ICD-10-CM

## 2014-07-08 DIAGNOSIS — J019 Acute sinusitis, unspecified: Secondary | ICD-10-CM

## 2014-07-08 DIAGNOSIS — R053 Chronic cough: Secondary | ICD-10-CM

## 2014-07-08 MED ORDER — AZITHROMYCIN 250 MG PO TABS: 250.0000 mg | ORAL_TABLET | ORAL | Status: DC

## 2014-07-08 MED ORDER — METHYLPREDNISOLONE ACETATE 80 MG/ML IJ SUSP
80.0000 mg | Freq: Once | INTRAMUSCULAR | Status: AC
  Administered 2014-07-08: 80 mg via INTRAMUSCULAR

## 2014-07-08 NOTE — Progress Notes (Signed)
Pre visit review using our clinic review tool, if applicable. No additional management support is needed unless otherwise documented below in the visit note.  Chief Complaint  Patient presents with  . Cough  . Sore Throat  . Ear Pain    HPI: Patient Marcia Irwin Needs  comes in today for SDA for  Persistent cough problem evaluation. See jan 9 visit  rx with antibiotic prev and then q var and inhalers  Since that time Has started to improve but then  Then last few days ago got ear ache  And coming.  back and green again .  Worse since wed last week . More congested   ocass sinus pain.    ? Low grade temp  (Had shot and antibiotic  For 10 days . ? augmentin   . Early December)   ROS: See pertinent positives and negatives per HPI.  Past Medical History  Diagnosis Date  . GERD (gastroesophageal reflux disease) 2006    with esophagitis and gi bleed   . Heart murmur   . UTI (lower urinary tract infection)   . Tubal pregnancy 1993  . Breast cyst     aspirated followed with imaging  . Headache(784.0)     episode diplopia eval neg mri work up  . ANXIETY, SITUATIONAL 04/24/2008    Qualifier: Diagnosis of  By: Regis Bill MD, Standley Brooking   . Unspecified vitamin D deficiency 11/13/2007    Qualifier: Diagnosis of  By: Regis Bill MD, Standley Brooking   . DIPLOPIA, HX OF 04/01/2008    Qualifier: Diagnosis of  By: Regis Bill MD, Standley Brooking   . BREAST MASS 01/28/2010    Qualifier: Diagnosis of  By: Charlett Blake MD, Erline Levine      Family History  Problem Relation Age of Onset  . Breast cancer    . Colon cancer    . Diabetes    . Hyperlipidemia    . Hypertension    . Stroke    . Heart disease    . Other      clots grandmother  . Colon cancer Father   . Colon cancer Paternal Grandfather     History   Social History  . Marital Status: Married    Spouse Name: N/A    Number of Children: N/A  . Years of Education: N/A   Social History Main Topics  . Smoking status: Never Smoker   . Smokeless tobacco: Never Used   Comment: rare  . Alcohol Use: 1.8 oz/week    3 Glasses of wine per week  . Drug Use: No  . Sexual Activity: None   Other Topics Concern  . None   Social History Narrative   Married   Regular exercise- no    HH of 2 except college  Age.   Pets cat and dog   Sleep   60 hours week  Travels.    Neg ets .   2-3 etoh per week.    Outpatient Encounter Prescriptions as of 07/08/2014  Medication Sig  . albuterol (PROVENTIL HFA;VENTOLIN HFA) 108 (90 BASE) MCG/ACT inhaler Inhale 2 puffs into the lungs every 4 (four) hours as needed for wheezing or shortness of breath.  . beclomethasone (QVAR) 80 MCG/ACT inhaler Inhale 1 puff into the lungs 2 (two) times daily.  . benzonatate (TESSALON) 100 MG capsule Take 1-2 capsules (100-200 mg total) by mouth 3 (three) times daily as needed for cough.  . fluticasone (FLONASE) 50 MCG/ACT nasal spray Place 2 sprays into both nostrils  daily.  . pantoprazole (PROTONIX) 40 MG tablet Take 1 tablet (40 mg total) by mouth daily.  Marland Kitchen zolpidem (AMBIEN) 5 MG tablet Take 1 tablet (5 mg total) by mouth at bedtime as needed for sleep.  Marland Kitchen azithromycin (ZITHROMAX Z-PAK) 250 MG tablet Take 1 tablet (250 mg total) by mouth as directed. Take 2 po first day, then 1 po qd    EXAM:  BP 114/82 mmHg  Temp(Src) 98.2 F (36.8 C) (Oral)  Ht 5\' 1"  (1.549 m)  Wt 145 lb 1.6 oz (65.817 kg)  BMI 27.43 kg/m2  Body mass index is 27.43 kg/(m^2).  GENERAL: vitals reviewed and listed above, alert, oriented, appears well hydrated and in no acute distress HEENT: atraumatic, conjunctiva  clear, no obvious abnormalities on inspection of external nose and ears  Quite congested  Cough  Non toxic  OP : no lesion edema or exudate  NECK: no obvious masses on inspection palpation  No adenopathy  LUNGS: clear to auscultation bilaterally, no wheezes, rales or rhonchi,    CV: HRRR, no clubbing cyanosis or  peripheral edema nl cap refill  MS: moves all extremities without noticeable focal   abnormality PSYCH: pleasant and cooperative, no obvious depression or anxiety  ASSESSMENT AND PLAN:  Discussed the following assessment and plan:  Cough, persistent  Protracted URI - relapsing sx  poss sinusitis   Acute sinusitis with symptoms greater than 10 days Pt says gets se of pred and doesn't better with injection if needed  Sx for 2 months and some relapsing    Sx past week    Risk benefit of medication discussed.  If  persistent or progressive consider other evaluation.   Expectant management.  -Patient advised to return or notify health care team  if symptoms worsen ,persist or new concerns arise.  Patient Instructions  Steroid today for  Relapsing sx  Antibiotic may or may not help but since relapsing sx will treat empirically. Avoid respiratory irritants   Cough can still be there for a while but the infection sx should improve .  In the next 7-10 days .    Standley Brooking. Damaya Channing M.D.

## 2014-07-08 NOTE — Patient Instructions (Signed)
Steroid today for  Relapsing sx  Antibiotic may or may not help but since relapsing sx will treat empirically. Avoid respiratory irritants   Cough can still be there for a while but the infection sx should improve .  In the next 7-10 days .

## 2014-07-08 NOTE — Addendum Note (Signed)
Addended by: Miles Costain T on: 07/08/2014 05:03 PM   Modules accepted: Orders

## 2014-07-11 ENCOUNTER — Telehealth: Payer: Self-pay | Admitting: Family Medicine

## 2014-07-11 NOTE — Telephone Encounter (Signed)
dont know why they need authorization from me  . But ok to give it.

## 2014-07-11 NOTE — Telephone Encounter (Signed)
Marcia Irwin with [facility] called to report that the pt received her Ambien in the mail but the package was damaged when she received it. The package was opened/slit.  [facility] is seeking authorization to re send another rx.  Please advise.  Thanks!

## 2014-07-12 NOTE — Telephone Encounter (Signed)
Left message on secure voicemail informing Megan to send out Ambien.  Left pt's dob and refill #.  Asked her to call back if she needs any further assistance.  Left office number.

## 2014-09-11 ENCOUNTER — Emergency Department (HOSPITAL_COMMUNITY): Payer: BLUE CROSS/BLUE SHIELD

## 2014-09-11 ENCOUNTER — Encounter (HOSPITAL_COMMUNITY): Payer: Self-pay | Admitting: Emergency Medicine

## 2014-09-11 ENCOUNTER — Emergency Department (HOSPITAL_COMMUNITY)
Admission: EM | Admit: 2014-09-11 | Discharge: 2014-09-12 | Disposition: A | Discharge status: Home / Self Care | Payer: BLUE CROSS/BLUE SHIELD | Attending: Emergency Medicine | Admitting: Emergency Medicine

## 2014-09-11 DIAGNOSIS — Z79899 Other long term (current) drug therapy: Secondary | ICD-10-CM | POA: Insufficient documentation

## 2014-09-11 DIAGNOSIS — R0602 Shortness of breath: Secondary | ICD-10-CM | POA: Diagnosis not present

## 2014-09-11 DIAGNOSIS — M549 Dorsalgia, unspecified: Secondary | ICD-10-CM | POA: Diagnosis not present

## 2014-09-11 DIAGNOSIS — R002 Palpitations: Secondary | ICD-10-CM

## 2014-09-11 DIAGNOSIS — Z87440 Personal history of urinary (tract) infections: Secondary | ICD-10-CM | POA: Insufficient documentation

## 2014-09-11 DIAGNOSIS — M79602 Pain in left arm: Secondary | ICD-10-CM | POA: Insufficient documentation

## 2014-09-11 DIAGNOSIS — Z8669 Personal history of other diseases of the nervous system and sense organs: Secondary | ICD-10-CM | POA: Diagnosis not present

## 2014-09-11 DIAGNOSIS — R011 Cardiac murmur, unspecified: Secondary | ICD-10-CM | POA: Diagnosis not present

## 2014-09-11 DIAGNOSIS — Z8639 Personal history of other endocrine, nutritional and metabolic disease: Secondary | ICD-10-CM | POA: Insufficient documentation

## 2014-09-11 DIAGNOSIS — Z7951 Long term (current) use of inhaled steroids: Secondary | ICD-10-CM | POA: Diagnosis not present

## 2014-09-11 DIAGNOSIS — K219 Gastro-esophageal reflux disease without esophagitis: Secondary | ICD-10-CM | POA: Insufficient documentation

## 2014-09-11 DIAGNOSIS — Z8659 Personal history of other mental and behavioral disorders: Secondary | ICD-10-CM | POA: Insufficient documentation

## 2014-09-11 LAB — CBC
HEMATOCRIT: 40.5 % (ref 36.0–46.0)
Hemoglobin: 13.2 g/dL (ref 12.0–15.0)
MCH: 28.4 pg (ref 26.0–34.0)
MCHC: 32.6 g/dL (ref 30.0–36.0)
MCV: 87.3 fL (ref 78.0–100.0)
PLATELETS: 274 10*3/uL (ref 150–400)
RBC: 4.64 MIL/uL (ref 3.87–5.11)
RDW: 13.4 % (ref 11.5–15.5)
WBC: 7.8 10*3/uL (ref 4.0–10.5)

## 2014-09-11 LAB — BASIC METABOLIC PANEL
Anion gap: 11 (ref 5–15)
BUN: 19 mg/dL (ref 6–23)
CO2: 26 mmol/L (ref 19–32)
Calcium: 9.9 mg/dL (ref 8.4–10.5)
Chloride: 104 mmol/L (ref 96–112)
Creatinine, Ser: 0.97 mg/dL (ref 0.50–1.10)
GFR calc non Af Amer: 63 mL/min — ABNORMAL LOW (ref >90)
GFR, EST AFRICAN AMERICAN: 73 mL/min — AB (ref >90)
Glucose, Bld: 105 mg/dL — ABNORMAL HIGH (ref 70–99)
POTASSIUM: 3.9 mmol/L (ref 3.5–5.1)
Sodium: 141 mmol/L (ref 135–145)

## 2014-09-11 LAB — I-STAT TROPONIN, ED: Troponin i, poc: 0 ng/mL (ref 0.00–0.08)

## 2014-09-11 MED ORDER — SODIUM CHLORIDE 0.9 % IV BOLUS (SEPSIS)
1000.0000 mL | INTRAVENOUS | Status: AC
  Administered 2014-09-11: 1000 mL via INTRAVENOUS

## 2014-09-11 NOTE — ED Notes (Signed)
Pt reports experiencing palpitations for past 2 days in neck- admits to pain in jaw and left arm.  Admits to SOB, denies N/V.

## 2014-09-11 NOTE — ED Provider Notes (Signed)
CSN: [id number]     Arrival date & time 09/11/14  2225 History   First MD Initiated Contact with Patient 09/11/14 2325     This chart was scribed for Pamella Pert, MD by Forrestine Him, ED Scribe. This patient was seen in room A04C/A04C and the patient's care was started 11:35 PM.   Chief Complaint  Patient presents with  . Palpitations   Patient is a 59 y.o. female presenting with palpitations.  Palpitations Onset quality:  Sudden Duration:  3 days Timing:  Intermittent Progression:  Unchanged Chronicity:  New Context: not anxiety and not exercise   Relieved by:  None tried Worsened by:  Nothing Ineffective treatments:  None tried Associated symptoms: back pain and shortness of breath   Associated symptoms: no chest pain, no cough, no dizziness, no nausea and no vomiting   Shortness of breath:    Severity:  Mild   Duration:  3 days   Timing:  Intermittent   Progression:  Unchanged Risk factors: stress   Risk factors: no heart disease     HPI Comments: Marcia Irwin is a 59 y.o. female with a PMHx of GERD, heart murmur, and anxiety who presents to the Emergency Department here for intermittent, ongoing palpitations x 2-3 days while at rest. She reports 3-4 episodes lasting 10-15 minutes at a time. Pt also reports intermittent, sudden onset L arm pain described as "pins" onset 9:00 PM this evening while at rest. Pain is currently rated 1-2/10. She admits to ongoing, constant back pain x 1 week. She has tried topical Bengay without any improvement for symptoms. No current cough, diarrhea, fever, or chills. She is an occasional drinker but is not an every day smoker. Pt currently takes Ambien and Protonix daily. No known allergies to medications.  Past Medical History  Diagnosis Date  . GERD (gastroesophageal reflux disease) 2006    with esophagitis and gi bleed   . Heart murmur   . UTI (lower urinary tract infection)   . Tubal pregnancy 1993  . Breast cyst     aspirated  followed with imaging  . Headache(784.0)     episode diplopia eval neg mri work up  . ANXIETY, SITUATIONAL 04/24/2008    Qualifier: Diagnosis of  By: Regis Bill MD, Standley Brooking   . Unspecified vitamin D deficiency 11/13/2007    Qualifier: Diagnosis of  By: Regis Bill MD, Standley Brooking   . DIPLOPIA, HX OF 04/01/2008    Qualifier: Diagnosis of  By: Regis Bill MD, Standley Brooking   . BREAST MASS 01/28/2010    Qualifier: Diagnosis of  By: Charlett Blake MD, Erline Levine     Past Surgical History  Procedure Laterality Date  . Breast enhancement surgery    . Dilation and curettage of uterus      x 2  . Tubal ligation    . Breast cyst aspiration    . Tonsillectomy    . Colonoscopy    . Upper gastrointestinal endoscopy     Family History  Problem Relation Age of Onset  . Breast cancer    . Colon cancer    . Diabetes    . Hyperlipidemia    . Hypertension    . Stroke    . Heart disease    . Other      clots grandmother  . Colon cancer Father   . Colon cancer Paternal Grandfather    History  Substance Use Topics  . Smoking status: Never Smoker   . Smokeless tobacco: Never Used  Comment: rare  . Alcohol Use: 1.8 oz/week    3 Glasses of wine per week   OB History    Gravida Para Term Preterm AB TAB SAB Ectopic Multiple Living   3 2             Review of Systems  Constitutional: Negative for fever and chills.  HENT: Negative for rhinorrhea and sore throat.   Eyes: Negative for visual disturbance.  Respiratory: Positive for shortness of breath. Negative for cough.   Cardiovascular: Positive for palpitations. Negative for chest pain and leg swelling.  Gastrointestinal: Negative for nausea, vomiting, abdominal pain and diarrhea.  Genitourinary: Negative for dysuria.  Musculoskeletal: Positive for back pain. Negative for neck pain.  Skin: Negative for rash.  Neurological: Negative for dizziness, light-headedness and headaches.  Hematological: Does not bruise/bleed easily.  Psychiatric/Behavioral: Negative for  confusion.      Allergies  Review of patient's allergies indicates no known allergies.  Home Medications   Prior to Admission medications   Medication Sig Start Date End Date Taking? Authorizing Provider  albuterol (PROVENTIL HFA;VENTOLIN HFA) 108 (90 BASE) MCG/ACT inhaler Inhale 2 puffs into the lungs every 4 (four) hours as needed for wheezing or shortness of breath. 06/14/14  Yes Janne Napoleon, NP  Ascorbic Acid (VITAMIN C PO) Take 1 tablet by mouth daily.   Yes Historical Provider, MD  beclomethasone (QVAR) 80 MCG/ACT inhaler Inhale 1 puff into the lungs 2 (two) times daily. 06/14/14  Yes Janne Napoleon, NP  naproxen sodium (ANAPROX) 220 MG tablet Take 440 mg by mouth daily as needed (general pain).   Yes Historical Provider, MD  pantoprazole (PROTONIX) 40 MG tablet Take 1 tablet (40 mg total) by mouth daily. 11/16/13  Yes Burnis Medin, MD  zolpidem (AMBIEN) 5 MG tablet Take 1 tablet (5 mg total) by mouth at bedtime as needed for sleep. 06/20/14  Yes Burnis Medin, MD  azithromycin (ZITHROMAX Z-PAK) 250 MG tablet Take 1 tablet (250 mg total) by mouth as directed. Take 2 po first day, then 1 po qd Patient not taking: Reported on 09/11/2014 07/08/14   Burnis Medin, MD  benzonatate (TESSALON) 100 MG capsule Take 1-2 capsules (100-200 mg total) by mouth 3 (three) times daily as needed for cough. Patient not taking: Reported on 09/11/2014 06/20/14   Burnis Medin, MD  fluticasone [facility]) 50 MCG/ACT nasal spray Place 2 sprays into both nostrils daily. Patient not taking: Reported on 09/11/2014 11/16/13   Burnis Medin, MD   Triage Vitals: BP 132/79 mmHg  Pulse 74  Temp(Src) 98.1 F (36.7 C) (Oral)  Resp 17  Ht 5\' 1"  (1.549 m)  Wt 142 lb (64.411 kg)  BMI 26.84 kg/m2  SpO2 98%   Physical Exam  Constitutional: She is oriented to person, place, and time. She appears well-developed and well-nourished. No distress.  HENT:  Head: Normocephalic and atraumatic.  Right Ear: Hearing normal.  Left Ear:  Hearing normal.  Nose: Nose normal.  Mouth/Throat: Oropharynx is clear and moist and mucous membranes are normal.  Eyes: Conjunctivae and EOM are normal. Pupils are equal, round, and reactive to light.  Neck: Normal range of motion. Neck supple.  Cardiovascular: Regular rhythm, S1 normal and S2 normal.  Exam reveals no gallop and no friction rub.   No murmur heard. Pulmonary/Chest: Effort normal and breath sounds normal. No respiratory distress. She exhibits no tenderness.  Abdominal: Soft. Normal appearance and bowel sounds are normal. There is no hepatosplenomegaly. There is  no tenderness. There is no rebound, no guarding, no tenderness at McBurney's point and negative Murphy's sign. No hernia.  Musculoskeletal: Normal range of motion.  Normal strength and sensation in upper and lower extremities 2 plus proximal and distal pulses in upper extremities 3 plus proximal and distal pulses in lower extremities  Symmetric lower extremities without focal tenderness  Neurological: She is alert and oriented to person, place, and time. She has normal strength. No cranial nerve deficit or sensory deficit. Coordination normal. GCS eye subscore is 4. GCS verbal subscore is 5. GCS motor subscore is 6.  Skin: Skin is warm, dry and intact. No rash noted. No cyanosis.  Psychiatric: She has a normal mood and affect. Her speech is normal and behavior is normal. Thought content normal.  Nursing note and vitals reviewed.   ED Course  Procedures (including critical care time)  DIAGNOSTIC STUDIES: Oxygen Saturation is 98% on RA, Normal by my interpretation.    COORDINATION OF CARE: 11:38 PM-Discussed treatment plan with pt at bedside and pt agreed to plan.     Labs Review Labs Reviewed  BASIC METABOLIC PANEL - Abnormal; Notable for the following:    Glucose, Bld 105 (*)    GFR calc non Af Amer 63 (*)    GFR calc Af Amer 73 (*)    All other components within normal limits  CBC  I-STAT TROPOININ, ED   I-STAT TROPOININ, ED    Imaging Review Dg Chest 2 View  09/11/2014   CLINICAL DATA:  Intermittent left-sided chest pain and palpitations. Initial encounter.  EXAM: CHEST  2 VIEW  COMPARISON:  Chest radiograph performed 06/14/2014  FINDINGS: The lungs are well-aerated and clear. There is no evidence of focal opacification, pleural effusion or pneumothorax.  The heart is normal in size; the mediastinal contour is within normal limits. No acute osseous abnormalities are seen.  IMPRESSION: No acute cardiopulmonary process seen.   Electronically Signed   By: Garald Balding M.D.   On: 09/11/2014 23:19     EKG Interpretation   Date/Time:  Wednesday September 11 2014 22:31:42 EDT Ventricular Rate:  80 PR Interval:  144 QRS Duration: 62 QT Interval:  386 QTC Calculation: 445 R Axis:   60 Text Interpretation:  Normal sinus rhythm Low voltage QRS Borderline ECG  No significant change since last tracing Confirmed by Deija Buhrman  MD,  Khloe Hunkele ([id number]) on 09/11/2014 11:26:30 PM      MDM   Final diagnoses:  Palpitations  Left arm pain    7:10 AM 59 y.o. female here with palpitations as well as some left arm and upper mid back pain. Family history of heart disease but no other significant risk factors. Vital signs unremarkable here. Low risk per HEART pathway for MACE given neg delta trop.    Will recommend cardiac workup as an outpatient. The patient is happy with this plan.  I have discussed the diagnosis/risks/treatment options with the patient and believe the pt to be eligible for discharge home to follow-up with her pcp. We also discussed returning to the ED immediately if new or worsening sx occur. We discussed the sx which are most concerning (e.g., worsening pain, sob, fever) that necessitate immediate return. Medications administered to the patient during their visit and any new prescriptions provided to the patient are listed below.  Medications given during this visit Medications  sodium  chloride 0.9 % bolus 1,000 mL (0 mLs Intravenous Stopped 09/12/14 0118)  acetaminophen (TYLENOL) tablet 650 mg (650 mg Oral  Given 09/12/14 0127)    Discharge Medication List as of 09/12/2014  3:09 AM        I personally performed the services described in this documentation, which was scribed in my presence. The recorded information has been reviewed and is accurate.    Pamella Pert, MD 09/12/14 [phone]

## 2014-09-12 DIAGNOSIS — R002 Palpitations: Secondary | ICD-10-CM | POA: Diagnosis not present

## 2014-09-12 LAB — I-STAT TROPONIN, ED: Troponin i, poc: 0 ng/mL (ref 0.00–0.08)

## 2014-09-12 MED ORDER — ACETAMINOPHEN 325 MG PO TABS
650.0000 mg | ORAL_TABLET | Freq: Once | ORAL | Status: AC
  Administered 2014-09-12: 650 mg via ORAL
  Filled 2014-09-12: qty 2

## 2014-09-12 NOTE — ED Notes (Signed)
Phlebotomy at bedside.

## 2014-09-13 ENCOUNTER — Telehealth: Payer: Self-pay | Admitting: Internal Medicine

## 2014-09-13 NOTE — Telephone Encounter (Signed)
Ok Next week ( not on April 8th) We can do hand referral if trigger finger  Or wait until we see her.

## 2014-09-13 NOTE — Telephone Encounter (Signed)
Pt needs hosp fup (30 min) went wed for chest pains.  also had trigger finger that would like to be addressed. hosp advised she may need cardiac referral pls advise on when to sched

## 2014-09-16 NOTE — Telephone Encounter (Signed)
Pt has been scheduled.  °

## 2014-09-19 ENCOUNTER — Ambulatory Visit (INDEPENDENT_AMBULATORY_CARE_PROVIDER_SITE_OTHER): Payer: BLUE CROSS/BLUE SHIELD | Admitting: Internal Medicine

## 2014-09-19 ENCOUNTER — Encounter: Payer: Self-pay | Admitting: Internal Medicine

## 2014-09-19 VITALS — BP 116/74 | Temp 98.5°F | Ht 61.0 in | Wt 146.0 lb

## 2014-09-19 DIAGNOSIS — M65311 Trigger thumb, right thumb: Secondary | ICD-10-CM | POA: Diagnosis not present

## 2014-09-19 DIAGNOSIS — R0789 Other chest pain: Secondary | ICD-10-CM

## 2014-09-19 DIAGNOSIS — R002 Palpitations: Secondary | ICD-10-CM

## 2014-09-19 LAB — TSH: TSH: 1.27 u[IU]/mL (ref 0.35–4.50)

## 2014-09-19 LAB — LIPID PANEL
CHOL/HDL RATIO: 5
Cholesterol: 260 mg/dL — ABNORMAL HIGH (ref 0–200)
HDL: 50.4 mg/dL (ref >39.00)
LDL Cholesterol: 176 mg/dL — ABNORMAL HIGH (ref 0–99)
NONHDL: 209.6
Triglycerides: 166 mg/dL — ABNORMAL HIGH (ref 0.0–149.0)
VLDL: 33.2 mg/dL (ref 0.0–40.0)

## 2014-09-19 LAB — T4, FREE: Free T4: 0.88 ng/dL (ref 0.60–1.60)

## 2014-09-19 NOTE — Progress Notes (Signed)
Pre visit review using our clinic review tool, if applicable. No additional management support is needed unless otherwise documented below in the visit note.  Chief Complaint  Patient presents with  . Post ED Follow Up    HPI: Patient come in for follow up from ED visit For atypical chest pain and palpitiations felt to be low risk   On 3 30 16   Also directed to cardiology for eval  Since that time   Not right but no pain but still with palpitation episodes 2-4 per day described as Fluttering in chest and throat Was driving back from hotel  [place].   And had back pain near bra line and left arm pain .  With palpitations   Still doesn't feel right  Lasts Quick to minutes . Some dizziness if last ling.  prob irreg.  Had had no etoh excess caffiene or decongestants  May 13 th.  appt  Not until   Also  Right thumb gets stuck  And some discomfort   No fall but strained stretched lifting boxes in December is left handed but uses right  A good bit.  ROS: See pertinent positives and negatives per HPI. No recent syncope ( see eval in remote past   Nl echo 2009)  Past Medical History  Diagnosis Date  . GERD (gastroesophageal reflux disease) 2006    with esophagitis and gi bleed   . Heart murmur   . UTI (lower urinary tract infection)   . Tubal pregnancy 1993  . Breast cyst     aspirated followed with imaging  . Headache(784.0)     episode diplopia eval neg mri work up  . ANXIETY, SITUATIONAL 04/24/2008    Qualifier: Diagnosis of  By: Regis Bill MD, Standley Brooking   . Unspecified vitamin D deficiency 11/13/2007    Qualifier: Diagnosis of  By: Regis Bill MD, Standley Brooking   . DIPLOPIA, HX OF 04/01/2008    Qualifier: Diagnosis of  By: Regis Bill MD, Standley Brooking   . BREAST MASS 01/28/2010    Qualifier: Diagnosis of  By: Charlett Blake MD, Erline Levine      Family History  Problem Relation Age of Onset  . Breast cancer    . Colon cancer    . Diabetes    . Hyperlipidemia    . Hypertension    . Stroke    . Heart disease    .  Other      clots grandmother  . Colon cancer Father   . Colon cancer Paternal Grandfather     History   Social History  . Marital Status: Married    Spouse Name: N/A  . Number of Children: N/A  . Years of Education: N/A   Social History Main Topics  . Smoking status: Never Smoker   . Smokeless tobacco: Never Used     Comment: rare  . Alcohol Use: 1.8 oz/week    3 Glasses of wine per week  . Drug Use: No  . Sexual Activity: Not on file   Other Topics Concern  . None   Social History Narrative   Married   Regular exercise- no    HH of 2 except college  Age.   Pets cat and dog   Sleep   60 hours week  Travels.    Neg ets .   2-3 etoh per week.    Outpatient Encounter Prescriptions as of 09/19/2014  Medication Sig  . Ascorbic Acid (VITAMIN C PO) Take 1 tablet by mouth daily.  Marland Kitchen  fluticasone (FLONASE) 50 MCG/ACT nasal spray Place 2 sprays into both nostrils daily.  . naproxen sodium (ANAPROX) 220 MG tablet Take 440 mg by mouth daily as needed (general pain).  . pantoprazole (PROTONIX) 40 MG tablet Take 1 tablet (40 mg total) by mouth daily.  Marland Kitchen zolpidem (AMBIEN) 5 MG tablet Take 1 tablet (5 mg total) by mouth at bedtime as needed for sleep.  Marland Kitchen albuterol (PROVENTIL HFA;VENTOLIN HFA) 108 (90 BASE) MCG/ACT inhaler Inhale 2 puffs into the lungs every 4 (four) hours as needed for wheezing or shortness of breath. (Patient not taking: Reported on 09/19/2014)  . beclomethasone (QVAR) 80 MCG/ACT inhaler Inhale 1 puff into the lungs 2 (two) times daily. (Patient not taking: Reported on 09/19/2014)  . [DISCONTINUED] azithromycin (ZITHROMAX Z-PAK) 250 MG tablet Take 1 tablet (250 mg total) by mouth as directed. Take 2 po first day, then 1 po qd (Patient not taking: Reported on 09/11/2014)  . [DISCONTINUED] benzonatate (TESSALON) 100 MG capsule Take 1-2 capsules (100-200 mg total) by mouth 3 (three) times daily as needed for cough. (Patient not taking: Reported on 09/11/2014)    EXAM:  BP  116/74 mmHg  Temp(Src) 98.5 F (36.9 C) (Oral)  Ht 5\' 1"  (1.549 m)  Wt 146 lb (66.225 kg)  BMI 27.60 kg/m2  Body mass index is 27.6 kg/(m^2).  GENERAL: vitals reviewed and listed above, alert, oriented, appears well hydrated and in no acute distress HEENT: atraumatic, conjunctiva  clear, no obvious abnormalities on inspection of external nose and ears OP : no lesion edema or exudate  NECK: no obvious masses on inspection palpation  LUNGS: clear to auscultation bilaterally, no wheezes, rales or rhonchi, good air movement CV: HRRR, no clubbing cyanosis or  peripheral edema nl cap refill  Abdomen:  Sof,t normal bowel sounds without hepatosplenomegaly, no guarding rebound or masses no CVA tenderness  MS: moves all extremities without noticeable focal  Abnormality except  Right thumb trigger thumb no redness or swelling  PSYCH: pleasant and cooperative, no obvious depression or anxiety Lab Results  Component Value Date   WBC 7.8 09/11/2014   HGB 13.2 09/11/2014   HCT 40.5 09/11/2014   PLT 274 09/11/2014   GLUCOSE 105* 09/11/2014   CHOL 223* 10/31/2012   TRIG 103.0 10/31/2012   HDL 53.40 10/31/2012   LDLDIRECT 151.8 10/31/2012   LDLCALC 120* 04/29/2009   ALT 16 10/31/2012   AST 15 10/31/2012   NA 141 09/11/2014   K 3.9 09/11/2014   CL 104 09/11/2014   CREATININE 0.97 09/11/2014   BUN 19 09/11/2014   CO2 26 09/11/2014   TSH 0.98 10/31/2012    ASSESSMENT AND PLAN:  Discussed the following assessment and plan:  Palpitations  - FU ed new onset  frequent  sound premature beats atypicla chest sx . thyroid lipid panel expectant referral to cardd - Plan: TSH, T4, free, Lipid panel  Atypical chest pain - fu ed   Trigger thumb of right hand - onset after lifting heavy boxes in december  ref hand   -Patient advised to return or notify health care team  if symptoms worsen ,persist or new concerns arise.  Patient Instructions  Will notify you  of labs when available.  Will  referral.  to cardiology . From [place] .  Avoid excess caffiene  Alcohol. decongestants .   Palpitations A palpitation is the feeling that your heartbeat is irregular or is faster than normal. It may feel like your heart is fluttering or skipping a  beat. Palpitations are usually not a serious problem. However, in some cases, you may need further medical evaluation. CAUSES  Palpitations can be caused by:  Smoking.  Caffeine or other stimulants, such as diet pills or energy drinks.  Alcohol.  Stress and anxiety.  Strenuous physical activity.  Fatigue.  Certain medicines.  Heart disease, especially if you have a history of irregular heart rhythms (arrhythmias), such as atrial fibrillation, atrial flutter, or supraventricular tachycardia.  An improperly working pacemaker or defibrillator. DIAGNOSIS  To find the cause of your palpitations, your health care provider will take your medical history and perform a physical exam. Your health care provider may also have you take a test called an ambulatory electrocardiogram (ECG). An ECG records your heartbeat patterns over a 24-hour period. You may also have other tests, such as:  Transthoracic echocardiogram (TTE). During echocardiography, sound waves are used to evaluate how blood flows through your heart.  Transesophageal echocardiogram (TEE).  Cardiac monitoring. This allows your health care provider to monitor your heart rate and rhythm in real time.  Holter monitor. This is a portable device that records your heartbeat and can help diagnose heart arrhythmias. It allows your health care provider to track your heart activity for several days, if needed.  Stress tests by exercise or by giving medicine that makes the heart beat faster. TREATMENT  Treatment of palpitations depends on the cause of your symptoms and can vary greatly. Most cases of palpitations do not require any treatment other than time, relaxation, and monitoring your  symptoms. Other causes, such as atrial fibrillation, atrial flutter, or supraventricular tachycardia, usually require further treatment. HOME CARE INSTRUCTIONS   Avoid:  Caffeinated coffee, tea, soft drinks, diet pills, and energy drinks.  Chocolate.  Alcohol.  Stop smoking if you smoke.  Reduce your stress and anxiety. Things that can help you relax include:  A method of controlling things in your body, such as your heartbeats, with your mind (biofeedback).  Yoga.  Meditation.  Physical activity such as swimming, jogging, or walking.  Get plenty of rest and sleep. SEEK MEDICAL CARE IF:   You continue to have a fast or irregular heartbeat beyond 24 hours.  Your palpitations occur more often. SEEK IMMEDIATE MEDICAL CARE IF:  You have chest pain or shortness of breath.  You have a severe headache.  You feel dizzy or you faint. MAKE SURE YOU:  Understand these instructions.  Will watch your condition.  Will get help right away if you are not doing well or get worse. Document Released: 05/28/2000 Document Revised: 06/05/2013 Document Reviewed: 07/30/2011 [facility] Patient Information 2015 [place], [place]. This information is not intended to replace advice given to you by your health care provider. Make sure you discuss any questions you have with your health care provider.       Standley Brooking. Brittany Osier M.D.

## 2014-09-19 NOTE — Patient Instructions (Signed)
Will notify you  of labs when available.  Will referral.  to cardiology . From [place] .  Avoid excess caffiene  Alcohol. decongestants .   Palpitations A palpitation is the feeling that your heartbeat is irregular or is faster than normal. It may feel like your heart is fluttering or skipping a beat. Palpitations are usually not a serious problem. However, in some cases, you may need further medical evaluation. CAUSES  Palpitations can be caused by:  Smoking.  Caffeine or other stimulants, such as diet pills or energy drinks.  Alcohol.  Stress and anxiety.  Strenuous physical activity.  Fatigue.  Certain medicines.  Heart disease, especially if you have a history of irregular heart rhythms (arrhythmias), such as atrial fibrillation, atrial flutter, or supraventricular tachycardia.  An improperly working pacemaker or defibrillator. DIAGNOSIS  To find the cause of your palpitations, your health care provider will take your medical history and perform a physical exam. Your health care provider may also have you take a test called an ambulatory electrocardiogram (ECG). An ECG records your heartbeat patterns over a 24-hour period. You may also have other tests, such as:  Transthoracic echocardiogram (TTE). During echocardiography, sound waves are used to evaluate how blood flows through your heart.  Transesophageal echocardiogram (TEE).  Cardiac monitoring. This allows your health care provider to monitor your heart rate and rhythm in real time.  Holter monitor. This is a portable device that records your heartbeat and can help diagnose heart arrhythmias. It allows your health care provider to track your heart activity for several days, if needed.  Stress tests by exercise or by giving medicine that makes the heart beat faster. TREATMENT  Treatment of palpitations depends on the cause of your symptoms and can vary greatly. Most cases of palpitations do not require any treatment other  than time, relaxation, and monitoring your symptoms. Other causes, such as atrial fibrillation, atrial flutter, or supraventricular tachycardia, usually require further treatment. HOME CARE INSTRUCTIONS   Avoid:  Caffeinated coffee, tea, soft drinks, diet pills, and energy drinks.  Chocolate.  Alcohol.  Stop smoking if you smoke.  Reduce your stress and anxiety. Things that can help you relax include:  A method of controlling things in your body, such as your heartbeats, with your mind (biofeedback).  Yoga.  Meditation.  Physical activity such as swimming, jogging, or walking.  Get plenty of rest and sleep. SEEK MEDICAL CARE IF:   You continue to have a fast or irregular heartbeat beyond 24 hours.  Your palpitations occur more often. SEEK IMMEDIATE MEDICAL CARE IF:  You have chest pain or shortness of breath.  You have a severe headache.  You feel dizzy or you faint. MAKE SURE YOU:  Understand these instructions.  Will watch your condition.  Will get help right away if you are not doing well or get worse. Document Released: 05/28/2000 Document Revised: 06/05/2013 Document Reviewed: 07/30/2011 [facility] Patient Information 2015 [place], [place]. This information is not intended to replace advice given to you by your health care provider. Make sure you discuss any questions you have with your health care provider.

## 2014-10-09 NOTE — Progress Notes (Signed)
Cardiology Office Note   Date:  10/10/2014   ID:  Marcia Irwin, DOB 1955/07/13, MRN [id number]  PCP:  Lottie Dawson, MD  Cardiologist:   Jenkins Rouge, MD   Chief Complaint  Patient presents with  . New Evaluation    chest pain, heart palpitations      History of Present Illness: Marcia Irwin is a 59 y.o. female who presents for  Palpitations and chest pain  Seen in ER 09/11/14 with negative w/u   PMHx of GERD, heart murmur, and anxiety who presents to the Emergency Department here for intermittent, ongoing palpitations x 2-3 days while at rest. She reports 3-4 episodes lasting 10-15 minutes at a time. Pt also reports intermittent, sudden onset L arm pain described as "pins" onset 9:00 PM this evening while at rest. Pain is currently rated 1-2/10. She admits to ongoing, constant back pain x 1 week. She has tried topical [place] without any improvement for symptoms. No current cough, diarrhea, fever, or chills. She is an occasional drinker but is not an every day smoker. Pt currently takes Ambien and Protonix daily. No known allergies to medications.  CXR reviewed NAD Labs reviewed and normal including troponin x 2 except for LDL 176    She travels a lot for work may be going through [employer].  Does not exercise enough  Since d/c palpitations infrequent I cup of Chai in am  Minimal ETOH no other stimulants    Past Medical History  Diagnosis Date  . GERD (gastroesophageal reflux disease) 2006    with esophagitis and gi bleed   . Heart murmur   . UTI (lower urinary tract infection)   . Tubal pregnancy 1993  . Breast cyst     aspirated followed with imaging  . Headache(784.0)     episode diplopia eval neg mri work up  . ANXIETY, SITUATIONAL 04/24/2008    Qualifier: Diagnosis of  By: Regis Bill MD, Standley Brooking   . Unspecified vitamin D deficiency 11/13/2007    Qualifier: Diagnosis of  By: Regis Bill MD, Standley Brooking   . DIPLOPIA, HX OF 04/01/2008    Qualifier: Diagnosis of   By: Regis Bill MD, Standley Brooking   . BREAST MASS 01/28/2010    Qualifier: Diagnosis of  By: Charlett Blake MD, Erline Levine      Past Surgical History  Procedure Laterality Date  . Breast enhancement surgery    . Dilation and curettage of uterus      x 2  . Tubal ligation    . Breast cyst aspiration    . Tonsillectomy    . Colonoscopy    . Upper gastrointestinal endoscopy       Current Outpatient Prescriptions  Medication Sig Dispense Refill  . albuterol (PROVENTIL HFA;VENTOLIN HFA) 108 (90 BASE) MCG/ACT inhaler Inhale 2 puffs into the lungs every 4 (four) hours as needed for wheezing or shortness of breath. (Patient not taking: Reported on 09/19/2014) 1 Inhaler 0  . Ascorbic Acid (VITAMIN C PO) Take 1 tablet by mouth daily.    . beclomethasone (QVAR) 80 MCG/ACT inhaler Inhale 1 puff into the lungs 2 (two) times daily. (Patient not taking: Reported on 09/19/2014) 1 Inhaler 0  . fluticasone (FLONASE) 50 MCG/ACT nasal spray Place 2 sprays into both nostrils daily. 16 g prn  . naproxen sodium (ANAPROX) 220 MG tablet Take 440 mg by mouth daily as needed (general pain).    . pantoprazole (PROTONIX) 40 MG tablet Take 1 tablet (40 mg total) by mouth daily. [place]  tablet 3  . zolpidem (AMBIEN) 5 MG tablet Take 1 tablet (5 mg total) by mouth at bedtime as needed for sleep. 90 tablet 1   No current facility-administered medications for this visit.    Allergies:   Review of patient's allergies indicates no known allergies.    Social History:  The patient  reports that she has never smoked. She has never used smokeless tobacco. She reports that she drinks about 1.8 oz of alcohol per week. She reports that she does not use illicit drugs.   Family History:  The patient's family history includes Breast cancer in an other family member; Colon cancer in her father, paternal grandfather, and another family member; Diabetes in an other family member; Heart disease in an other family member; Hyperlipidemia in an other family member;  Hypertension in an other family member; Other in an other family member; Stroke in an other family member.    ROS:  Please see the history of present illness.   Otherwise, review of systems are positive for none.   All other systems are reviewed and negative.    PHYSICAL EXAM: VS:  There were no vitals taken for this visit. , BMI There is no weight on file to calculate BMI. Affect appropriate Healthy:  appears stated age 69: normal Neck supple with no adenopathy JVP normal no bruits no thyromegaly Lungs clear with no wheezing and good diaphragmatic motion Heart:  S1/S2 no murmur, no rub, gallop or click PMI normal Abdomen: benighn, BS positve, no tenderness, no AAA no bruit.  No HSM or HJR Distal pulses intact with no bruits No edema Neuro non-focal Skin warm and dry No muscular weakness    EKG:   SR rate 80 low voltage normal ST segments 09/11/14    Recent Labs: 09/11/2014: BUN 19; Creatinine 0.97; Hemoglobin 13.2; Platelets 274; Potassium 3.9; Sodium 141 09/19/2014: TSH 1.27    Lipid Panel    Component Value Date/Time   CHOL 260* 09/19/2014 0930   TRIG 166.0* 09/19/2014 0930   HDL 50.40 09/19/2014 0930   CHOLHDL 5 09/19/2014 0930   VLDL 33.2 09/19/2014 0930   LDLCALC 176* 09/19/2014 0930   LDLDIRECT 151.8 10/31/2012 1147      Wt Readings from Last 3 Encounters:  09/19/14 146 lb (66.225 kg)  09/11/14 142 lb (64.411 kg)  07/08/14 145 lb 1.6 oz (65.817 kg)      Other studies Reviewed: Additional studies/ records that were reviewed today include: ER records.    ASSESSMENT AND PLAN:  1.  Palpitations:  Benign related to life style issues.  PRN Inderal No need for echo or monitor at this time. Normal exam, ECG, telemetry with symptoms and CXR Speak to no structural heart disease 2. Menapause has fu visit with OB/Gyn next week suspect she will have hormone levels drawn 3. GERD:  Continue protonix low carb diet   Current medicines are reviewed at length  with the patient today.  The patient does not have concerns regarding medicines.  The following changes have been made:  PRN inderal 10 mg  Labs/ tests ordered today include: None  No orders of the defined types were placed in this encounter.     Disposition:   FU with me PRN     Signed, Jenkins Rouge, MD  10/10/2014 9:38 AM    [facility] [place], [place], [place]  [address] Phone: [phone]; Fax: [phone]

## 2014-10-10 ENCOUNTER — Encounter: Payer: Self-pay | Admitting: Cardiovascular Disease

## 2014-10-10 ENCOUNTER — Ambulatory Visit (INDEPENDENT_AMBULATORY_CARE_PROVIDER_SITE_OTHER): Payer: BLUE CROSS/BLUE SHIELD | Admitting: Cardiovascular Disease

## 2014-10-10 VITALS — BP 118/76 | HR 90 | Ht 61.5 in | Wt 144.8 lb

## 2014-10-10 DIAGNOSIS — R002 Palpitations: Secondary | ICD-10-CM | POA: Diagnosis not present

## 2014-10-10 MED ORDER — PROPRANOLOL HCL 10 MG PO TABS: 10.0000 mg | ORAL_TABLET | Freq: Every day | ORAL | Status: DC | PRN

## 2014-10-10 NOTE — Patient Instructions (Signed)
Medication Instructions:  PROPRANOLOL 10 MG  1 DAILY AS  NEEDED  FOR  PALPITATIONS  Labwork: NONE  Testing/Procedures: NONE  Follow-Up: AS NEEDED  Any Other Special Instructions Will Be Listed Below (If Applicable).

## 2014-10-16 ENCOUNTER — Ambulatory Visit (INDEPENDENT_AMBULATORY_CARE_PROVIDER_SITE_OTHER): Payer: BLUE CROSS/BLUE SHIELD | Admitting: Internal Medicine

## 2014-10-16 ENCOUNTER — Encounter: Payer: Self-pay | Admitting: Internal Medicine

## 2014-10-16 VITALS — BP 118/72 | Temp 98.3°F | Ht 61.0 in | Wt 145.1 lb

## 2014-10-16 DIAGNOSIS — M65311 Trigger thumb, right thumb: Secondary | ICD-10-CM | POA: Diagnosis not present

## 2014-10-16 DIAGNOSIS — R739 Hyperglycemia, unspecified: Secondary | ICD-10-CM | POA: Diagnosis not present

## 2014-10-16 DIAGNOSIS — E785 Hyperlipidemia, unspecified: Secondary | ICD-10-CM

## 2014-10-16 DIAGNOSIS — R002 Palpitations: Secondary | ICD-10-CM | POA: Diagnosis not present

## 2014-10-16 NOTE — Patient Instructions (Signed)
Glad you are doing better .  Cholesterol is high .   10 year risk is 3.4 which is low risk  Intensify lifestyle interventions.      Why follow it? Research shows. . Those who follow the Mediterranean diet have a reduced risk of heart disease  . The diet is associated with a reduced incidence of Parkinson's and Alzheimer's diseases . People following the diet may have longer life expectancies and lower rates of chronic diseases  . The Dietary Guidelines for Americans recommends the Mediterranean diet as an eating plan to promote health and prevent disease  What Is the Mediterranean Diet?  . Healthy eating plan based on typical foods and recipes of Mediterranean-style cooking . The diet is primarily a plant based diet; these foods should make up a majority of meals   Starches - Plant based foods should make up a majority of meals - They are an important sources of vitamins, minerals, energy, antioxidants, and fiber - Choose whole grains, foods high in fiber and minimally processed items  - Typical grain sources include wheat, oats, barley, corn, brown rice, bulgar, farro, millet, polenta, couscous  - Various types of beans include chickpeas, lentils, fava beans, black beans, white beans   Fruits  Veggies - Large quantities of antioxidant rich fruits & veggies; 6 or more servings  - Vegetables can be eaten raw or lightly drizzled with oil and cooked  - Vegetables common to the traditional Mediterranean Diet include: artichokes, arugula, beets, broccoli, brussel sprouts, cabbage, carrots, celery, collard greens, cucumbers, eggplant, kale, leeks, lemons, lettuce, mushrooms, okra, onions, peas, peppers, potatoes, pumpkin, radishes, rutabaga, shallots, spinach, sweet potatoes, turnips, zucchini - Fruits common to the Mediterranean Diet include: apples, apricots, avocados, cherries, clementines, dates, figs, grapefruits, grapes, melons, nectarines, oranges, peaches, pears, pomegranates,  strawberries, tangerines  Fats - Replace butter and margarine with healthy oils, such as olive oil, canola oil, and tahini  - Limit nuts to no more than a handful a day  - Nuts include walnuts, almonds, pecans, pistachios, pine nuts  - Limit or avoid candied, honey roasted or heavily salted nuts - Olives are central to the [employer] - can be eaten whole or used in a variety of dishes   Meats Protein - Limiting red meat: no more than a few times a month - When eating red meat: choose lean cuts and keep the portion to the size of deck of cards - Eggs: approx. 0 to 4 times a week  - Fish and lean poultry: at least 2 a week  - Healthy protein sources include, chicken, [place], lean beef, lamb - Increase intake of seafood such as tuna, salmon, trout, mackerel, shrimp, scallops - Avoid or limit high fat processed meats such as sausage and bacon  Dairy - Include moderate amounts of low fat dairy products  - Focus on healthy dairy such as fat free yogurt, skim milk, low or reduced fat cheese - Limit dairy products higher in fat such as whole or 2% milk, cheese, ice cream  Alcohol - Moderate amounts of red wine is ok  - No more than 5 oz daily for women (all ages) and men older than age 21  - No more than 10 oz of wine daily for men younger than 58  Other - Limit sweets and other desserts  - Use herbs and spices instead of salt to flavor foods  - Herbs and spices common to the traditional Mediterranean Diet include: basil, bay leaves, chives, cloves,  cumin, fennel, garlic, lavender, marjoram, mint, oregano, parsley, pepper, rosemary, sage, savory, sumac, tarragon, thyme   It's not just a diet, it's a lifestyle:  . The Mediterranean diet includes lifestyle factors typical of those in the region  . Foods, drinks and meals are best eaten with others and savored . Daily physical activity is important for overall good health . This could be strenuous exercise like running and aerobics . This  could also be more leisurely activities such as walking, housework, yard-work, or taking the stairs . Moderation is the key; a balanced and healthy diet accommodates most foods and drinks . Consider portion sizes and frequency of consumption of certain foods   Meal Ideas & Options:  . Breakfast:  o Whole wheat toast or whole wheat English muffins with peanut butter & hard boiled egg o Steel cut oats topped with apples & cinnamon and skim milk  o Fresh fruit: banana, strawberries, melon, berries, peaches  o Smoothies: strawberries, bananas, greek yogurt, peanut butter o Low fat greek yogurt with blueberries and granola  o Egg white omelet with spinach and mushrooms o Breakfast couscous: whole wheat couscous, apricots, skim milk, cranberries  . Sandwiches:  o Hummus and grilled vegetables (peppers, zucchini, squash) on whole wheat bread   o Grilled chicken on whole wheat pita with lettuce, tomatoes, cucumbers or tzatziki  o Tuna salad on whole wheat bread: tuna salad made with greek yogurt, olives, red peppers, capers, green onions o Garlic rosemary lamb pita: lamb sauted with garlic, rosemary, salt & pepper; add lettuce, cucumber, greek yogurt to pita - flavor with lemon juice and black pepper  . Seafood:  o Mediterranean grilled salmon, seasoned with garlic, basil, parsley, lemon juice and black pepper o Shrimp, lemon, and spinach whole-grain pasta salad made with low fat greek yogurt  o Seared scallops with lemon orzo  o Seared tuna steaks seasoned salt, pepper, coriander topped with tomato mixture of olives, tomatoes, olive oil, minced garlic, parsley, green onions and cappers  . Meats:  o Herbed greek chicken salad with kalamata olives, cucumber, feta  o Red bell peppers stuffed with spinach, bulgur, lean ground beef (or lentils) & topped with feta   o Kebabs: skewers of chicken, tomatoes, onions, zucchini, squash  o [place] burgers: made with red onions, mint, dill, lemon juice, feta  cheese topped with roasted red peppers . Vegetarian o Cucumber salad: cucumbers, artichoke hearts, celery, red onion, feta cheese, tossed in olive oil & lemon juice  o Hummus and whole grain pita points with a greek salad (lettuce, tomato, feta, olives, cucumbers, red onion) o Lentil soup with celery, carrots made with vegetable broth, garlic, salt and pepper  o Tabouli salad: parsley, bulgur, mint, scallions, cucumbers, tomato, radishes, lemon juice, olive oil, salt and pepper.

## 2014-10-16 NOTE — Progress Notes (Signed)
Pre visit review using our clinic review tool, if applicable. No additional management support is needed unless otherwise documented below in the visit note.   Chief Complaint  Patient presents with  . Follow-up    HPI: Marcia Irwin 59 y.o.  Comes in fu of eval for palp and  atypical chest .  Saw dr Johnsie Cancel felet no need for further wu . To use inderal as needed.  Sx are less.  Stress poss .  Finger injection helped a lot feels nl has fu appt  Lab review not eating as healthy recently   ROS: See pertinent positives and negatives per HPI.  Past Medical History  Diagnosis Date  . GERD (gastroesophageal reflux disease) 2006    with esophagitis and gi bleed   . Heart murmur   . UTI (lower urinary tract infection)   . Tubal pregnancy 1993  . Breast cyst     aspirated followed with imaging  . Headache(784.0)     episode diplopia eval neg mri work up  . ANXIETY, SITUATIONAL 04/24/2008    Qualifier: Diagnosis of  By: Regis Bill MD, Standley Brooking   . Unspecified vitamin D deficiency 11/13/2007    Qualifier: Diagnosis of  By: Regis Bill MD, Standley Brooking   . DIPLOPIA, HX OF 04/01/2008    Qualifier: Diagnosis of  By: Regis Bill MD, Standley Brooking   . BREAST MASS 01/28/2010    Qualifier: Diagnosis of  By: Charlett Blake MD, Erline Levine      Family History  Problem Relation Age of Onset  . Breast cancer    . Colon cancer    . Diabetes    . Hyperlipidemia    . Hypertension    . Stroke    . Heart disease    . Other      clots grandmother  . Colon cancer Father   . Heart attack Father   . Colon cancer Paternal Grandfather     History   Social History  . Marital Status: Married    Spouse Name: N/A  . Number of Children: N/A  . Years of Education: N/A   Social History Main Topics  . Smoking status: Never Smoker   . Smokeless tobacco: Never Used     Comment: rare  . Alcohol Use: 1.8 oz/week    3 Glasses of wine per week  . Drug Use: No  . Sexual Activity: Not on file   Other Topics Concern  . None    Social History Narrative   Married   Regular exercise- no    HH of 2 except college  Age.   Pets cat and dog   Sleep   60 hours week  Travels.    Neg ets .   2-3 etoh per week.    Outpatient Prescriptions Prior to Visit  Medication Sig Dispense Refill  . Ascorbic Acid (VITAMIN C PO) Take 1 tablet by mouth daily.    . naproxen sodium (ANAPROX) 220 MG tablet Take 440 mg by mouth daily as needed (general pain).    . pantoprazole (PROTONIX) 40 MG tablet Take 1 tablet (40 mg total) by mouth daily. 90 tablet 3  . zolpidem (AMBIEN) 5 MG tablet Take 1 tablet (5 mg total) by mouth at bedtime as needed for sleep. 90 tablet 1  . propranolol (INDERAL) 10 MG tablet Take 1 tablet (10 mg total) by mouth daily as needed. (Patient not taking: Reported on 10/16/2014) 30 tablet 3   No facility-administered medications prior to visit.  EXAM:  BP 118/72 mmHg  Temp(Src) 98.3 F (36.8 C) (Oral)  Ht 5\' 1"  (1.549 m)  Wt 145 lb 1.6 oz (65.817 kg)  BMI 27.43 kg/m2  Body mass index is 27.43 kg/(m^2).  GENERAL: vitals reviewed and listed above, alert, oriented, appears well hydrated and in no acute distress HEENT: atraumatic, conjunctiva  clear, no obvious abnormalities on inspection of external nose and earsMS: moves all extremities without noticeable focal  abnormality PSYCH: pleasant and cooperative, no obvious depression or anxiety Lab Results  Component Value Date   WBC 7.8 09/11/2014   HGB 13.2 09/11/2014   HCT 40.5 09/11/2014   PLT 274 09/11/2014   GLUCOSE 105* 09/11/2014   CHOL 260* 09/19/2014   TRIG 166.0* 09/19/2014   HDL 50.40 09/19/2014   LDLDIRECT 151.8 10/31/2012   LDLCALC 176* 09/19/2014   ALT 16 10/31/2012   AST 15 10/31/2012   NA 141 09/11/2014   K 3.9 09/11/2014   CL 104 09/11/2014   CREATININE 0.97 09/11/2014   BUN 19 09/11/2014   CO2 26 09/11/2014   TSH 1.27 09/19/2014    ASSESSMENT AND PLAN:  Discussed the following assessment and plan:  Hyperlipidemia -  prob part familial  10 years low category but ldl high ilsi and fu   Palpitations  - better poss stress  to use as needed inderal follow   Trigger thumb of right hand - better after  cs inj   Hyperglycemia - mild reviewed indication for statin  Advise Intensify lifestyle interventions. bg borderleine fam hx  Lipid a1c inin 4-6 months  -Patient advised to return or notify health care team  if symptoms worsen ,persist or new concerns arise.  Patient Instructions   Glad you are doing better .  Cholesterol is high .   10 year risk is 3.4 which is low risk  Intensify lifestyle interventions.      Why follow it? Research shows. . Those who follow the Mediterranean diet have a reduced risk of heart disease  . The diet is associated with a reduced incidence of Parkinson's and Alzheimer's diseases . People following the diet may have longer life expectancies and lower rates of chronic diseases  . The Dietary Guidelines for Americans recommends the Mediterranean diet as an eating plan to promote health and prevent disease  What Is the Mediterranean Diet?  . Healthy eating plan based on typical foods and recipes of Mediterranean-style cooking . The diet is primarily a plant based diet; these foods should make up a majority of meals   Starches - Plant based foods should make up a majority of meals - They are an important sources of vitamins, minerals, energy, antioxidants, and fiber - Choose whole grains, foods high in fiber and minimally processed items  - Typical grain sources include wheat, oats, barley, corn, brown rice, bulgar, farro, millet, polenta, couscous  - Various types of beans include chickpeas, lentils, fava beans, black beans, white beans   Fruits  Veggies - Large quantities of antioxidant rich fruits & veggies; 6 or more servings  - Vegetables can be eaten raw or lightly drizzled with oil and cooked  - Vegetables common to the traditional Mediterranean Diet include:  artichokes, arugula, beets, broccoli, brussel sprouts, cabbage, carrots, celery, collard greens, cucumbers, eggplant, kale, leeks, lemons, lettuce, mushrooms, okra, onions, peas, peppers, potatoes, pumpkin, radishes, rutabaga, shallots, spinach, sweet potatoes, turnips, zucchini - Fruits common to the Mediterranean Diet include: apples, apricots, avocados, cherries, clementines, dates, figs, grapefruits, grapes, melons, nectarines, oranges,  peaches, pears, pomegranates, strawberries, tangerines  Fats - Replace butter and margarine with healthy oils, such as olive oil, canola oil, and tahini  - Limit nuts to no more than a handful a day  - Nuts include walnuts, almonds, pecans, pistachios, pine nuts  - Limit or avoid candied, honey roasted or heavily salted nuts - Olives are central to the Mediterranean diet - can be eaten whole or used in a variety of dishes   Meats Protein - Limiting red meat: no more than a few times a month - When eating red meat: choose lean cuts and keep the portion to the size of deck of cards - Eggs: approx. 0 to 4 times a week  - Fish and lean poultry: at least 2 a week  - Healthy protein sources include, chicken, [place], lean beef, lamb - Increase intake of seafood such as tuna, salmon, trout, mackerel, shrimp, scallops - Avoid or limit high fat processed meats such as sausage and bacon  Dairy - Include moderate amounts of low fat dairy products  - Focus on healthy dairy such as fat free yogurt, skim milk, low or reduced fat cheese - Limit dairy products higher in fat such as whole or 2% milk, cheese, ice cream  Alcohol - Moderate amounts of red wine is ok  - No more than 5 oz daily for women (all ages) and men older than age 66  - No more than 10 oz of wine daily for men younger than 39  Other - Limit sweets and other desserts  - Use herbs and spices instead of salt to flavor foods  - Herbs and spices common to the traditional Mediterranean Diet include: basil, bay  leaves, chives, cloves, cumin, fennel, garlic, lavender, marjoram, mint, oregano, parsley, pepper, rosemary, sage, savory, sumac, tarragon, thyme   It's not just a diet, it's a lifestyle:  . The Mediterranean diet includes lifestyle factors typical of those in the region  . Foods, drinks and meals are best eaten with others and savored . Daily physical activity is important for overall good health . This could be strenuous exercise like running and aerobics . This could also be more leisurely activities such as walking, housework, yard-work, or taking the stairs . Moderation is the key; a balanced and healthy diet accommodates most foods and drinks . Consider portion sizes and frequency of consumption of certain foods   Meal Ideas & Options:  . Breakfast:  o Whole wheat toast or whole wheat English muffins with peanut butter & hard boiled egg o Steel cut oats topped with apples & cinnamon and skim milk  o Fresh fruit: banana, strawberries, melon, berries, peaches  o Smoothies: strawberries, bananas, greek yogurt, peanut butter o Low fat greek yogurt with blueberries and granola  o Egg white omelet with spinach and mushrooms o Breakfast couscous: whole wheat couscous, apricots, skim milk, cranberries  . Sandwiches:  o Hummus and grilled vegetables (peppers, zucchini, squash) on whole wheat bread   o Grilled chicken on whole wheat pita with lettuce, tomatoes, cucumbers or tzatziki  o Tuna salad on whole wheat bread: tuna salad made with greek yogurt, olives, red peppers, capers, green onions o Garlic rosemary lamb pita: lamb sauted with garlic, rosemary, salt & pepper; add lettuce, cucumber, greek yogurt to pita - flavor with lemon juice and black pepper  . Seafood:  o Mediterranean grilled salmon, seasoned with garlic, basil, parsley, lemon juice and black pepper o Shrimp, lemon, and spinach whole-grain pasta salad made  with low fat greek yogurt  o Seared scallops with lemon orzo   o Seared tuna steaks seasoned salt, pepper, coriander topped with tomato mixture of olives, tomatoes, olive oil, minced garlic, parsley, green onions and cappers  . Meats:  o Herbed greek chicken salad with kalamata olives, cucumber, feta  o Red bell peppers stuffed with spinach, bulgur, lean ground beef (or lentils) & topped with feta   o Kebabs: skewers of chicken, tomatoes, onions, zucchini, squash  o [place] burgers: made with red onions, mint, dill, lemon juice, feta cheese topped with roasted red peppers . Vegetarian o Cucumber salad: cucumbers, artichoke hearts, celery, red onion, feta cheese, tossed in olive oil & lemon juice  o Hummus and whole grain pita points with a greek salad (lettuce, tomato, feta, olives, cucumbers, red onion) o Lentil soup with celery, carrots made with vegetable broth, garlic, salt and pepper  o Tabouli salad: parsley, bulgur, mint, scallions, cucumbers, tomato, radishes, lemon juice, olive oil, salt and pepper.           Standley Brooking. Panosh M.D.

## 2014-10-25 ENCOUNTER — Ambulatory Visit: Payer: BLUE CROSS/BLUE SHIELD | Admitting: Cardiovascular Disease

## 2014-10-25 ENCOUNTER — Encounter: Payer: Self-pay | Admitting: *Deleted

## 2014-11-15 ENCOUNTER — Telehealth: Payer: Self-pay | Admitting: *Deleted

## 2014-11-15 MED ORDER — ZOLPIDEM TARTRATE 5 MG PO TABS: 5.0000 mg | ORAL_TABLET | Freq: Every evening | ORAL | Status: DC | PRN

## 2014-11-15 NOTE — Telephone Encounter (Signed)
Faxed to the pharmacy.  Received transmission that fax was successful.

## 2014-11-15 NOTE — Telephone Encounter (Signed)
Patient is requesting a refill of Ambien 5 mg, take one tab at bedtime as needed for sleep [facility]

## 2014-11-15 NOTE — Telephone Encounter (Signed)
Printed for [facility] to sign and can be faxed

## 2014-11-15 NOTE — Telephone Encounter (Signed)
Ok to refill x 1 90 #

## 2014-11-25 ENCOUNTER — Telehealth: Payer: Self-pay | Admitting: Internal Medicine

## 2014-11-25 MED ORDER — CIPROFLOXACIN HCL 500 MG PO TABS: 500.0000 mg | ORAL_TABLET | Freq: Two times a day (BID) | ORAL | Status: DC

## 2014-11-25 NOTE — Telephone Encounter (Signed)
Spoke to the pt.  She is asking for travelers diarrhea medication.  Please advise.  Thanks!

## 2014-11-25 NOTE — Telephone Encounter (Signed)
Pt called to say she is traveling to [place] on Friday and is asking for a rx that was given to her about 4 years ago. She said she think it was [facility]

## 2014-11-25 NOTE — Telephone Encounter (Signed)
Left message informing the pt that medication has been sent to the pharmacy.

## 2014-11-25 NOTE — Telephone Encounter (Signed)
Can use cipro or azithro   cipro 500 mg 1 po bid for 3 days disp 6 if needed for travelers diarrhea.

## 2014-11-27 ENCOUNTER — Other Ambulatory Visit: Payer: Self-pay | Admitting: Gynecology

## 2014-11-28 LAB — CYTOLOGY - PAP

## 2015-01-10 ENCOUNTER — Encounter: Payer: Self-pay | Admitting: Internal Medicine

## 2015-01-10 ENCOUNTER — Encounter: Payer: Self-pay | Admitting: Gastroenterology

## 2015-01-29 ENCOUNTER — Other Ambulatory Visit: Payer: Self-pay | Admitting: Family Medicine

## 2015-01-30 MED ORDER — ZOLPIDEM TARTRATE 5 MG PO TABS: 5.0000 mg | ORAL_TABLET | Freq: Every evening | ORAL | Status: DC | PRN

## 2015-01-30 NOTE — Telephone Encounter (Signed)
Faxed to the pharmacy.

## 2015-01-30 NOTE — Telephone Encounter (Signed)
Ok to refill 90 days  Has upcoming appt.

## 2015-01-30 NOTE — Telephone Encounter (Signed)
Pt following up on refill for zolpidem (AMBIEN) 5 MG tablet

## 2015-02-11 ENCOUNTER — Other Ambulatory Visit (INDEPENDENT_AMBULATORY_CARE_PROVIDER_SITE_OTHER): Payer: BLUE CROSS/BLUE SHIELD

## 2015-02-11 DIAGNOSIS — E119 Type 2 diabetes mellitus without complications: Secondary | ICD-10-CM | POA: Diagnosis not present

## 2015-02-11 DIAGNOSIS — E785 Hyperlipidemia, unspecified: Secondary | ICD-10-CM

## 2015-02-11 LAB — LIPID PANEL
CHOLESTEROL: 212 mg/dL — AB (ref 0–200)
HDL: 44.8 mg/dL (ref >39.00)
LDL Cholesterol: 148 mg/dL — ABNORMAL HIGH (ref 0–99)
NonHDL: 167.46
Total CHOL/HDL Ratio: 5
Triglycerides: 99 mg/dL (ref 0.0–149.0)
VLDL: 19.8 mg/dL (ref 0.0–40.0)

## 2015-02-11 LAB — HEMOGLOBIN A1C: Hgb A1c MFr Bld: 5.5 % (ref 4.6–6.5)

## 2015-02-18 ENCOUNTER — Encounter: Payer: BLUE CROSS/BLUE SHIELD | Admitting: Internal Medicine

## 2015-02-18 NOTE — Progress Notes (Signed)
Document opened and reviewed for OVwellness visit . No showed .   

## 2015-04-22 ENCOUNTER — Other Ambulatory Visit: Payer: Self-pay | Admitting: Internal Medicine

## 2015-04-22 NOTE — Telephone Encounter (Signed)
Pt needs refill on ambien 5 mg #90 w/refills send to [facility]

## 2015-04-22 NOTE — Telephone Encounter (Signed)
She is due for ROV 6 months check  And was supposed to get lipid panel and hg a1c pre visit  She missed her appt in September   Please arrange labs and ROV    Ok to refill 30 days of med in interim

## 2015-04-23 NOTE — Telephone Encounter (Signed)
lmom for pt husband to return my call

## 2015-04-23 NOTE — Telephone Encounter (Signed)
Pt has had lab work but did not come in for appt.  Ok to get her on the schedule and send in a 90 day supply to 90 day supply company?

## 2015-04-23 NOTE — Telephone Encounter (Signed)
i didn't see that she had the lab done but not the ov . Ok to do 90 days but no more refills  Until seen

## 2015-04-23 NOTE — Telephone Encounter (Signed)
Pt had blood work in aug 2016. Should I just schedule rov or repeat blood work

## 2015-04-24 NOTE — Telephone Encounter (Signed)
Pt has been sch for jan 2017 °

## 2015-04-25 MED ORDER — ZOLPIDEM TARTRATE 5 MG PO TABS: 5.0000 mg | ORAL_TABLET | Freq: Every evening | ORAL | Status: DC | PRN

## 2015-04-25 NOTE — Telephone Encounter (Signed)
Faxed to the pharmacy.

## 2015-05-15 HISTORY — PX: TRIGGER FINGER RELEASE: SHX641

## 2015-06-05 DIAGNOSIS — M65311 Trigger thumb, right thumb: Secondary | ICD-10-CM | POA: Insufficient documentation

## 2015-06-25 ENCOUNTER — Other Ambulatory Visit: Payer: Self-pay | Admitting: Family Medicine

## 2015-06-26 NOTE — Telephone Encounter (Signed)
Over due for appt   Has appt tomorrow   Will address refill at that time

## 2015-06-27 ENCOUNTER — Encounter: Payer: Self-pay | Admitting: Internal Medicine

## 2015-06-27 ENCOUNTER — Ambulatory Visit (INDEPENDENT_AMBULATORY_CARE_PROVIDER_SITE_OTHER): Payer: BLUE CROSS/BLUE SHIELD | Admitting: Internal Medicine

## 2015-06-27 VITALS — BP 116/72 | Temp 98.1°F | Wt 132.3 lb

## 2015-06-27 DIAGNOSIS — K219 Gastro-esophageal reflux disease without esophagitis: Secondary | ICD-10-CM | POA: Diagnosis not present

## 2015-06-27 DIAGNOSIS — G4700 Insomnia, unspecified: Secondary | ICD-10-CM | POA: Diagnosis not present

## 2015-06-27 DIAGNOSIS — Z79899 Other long term (current) drug therapy: Secondary | ICD-10-CM

## 2015-06-27 DIAGNOSIS — E785 Hyperlipidemia, unspecified: Secondary | ICD-10-CM | POA: Diagnosis not present

## 2015-06-27 MED ORDER — PANTOPRAZOLE SODIUM 40 MG PO TBEC: 40.0000 mg | DELAYED_RELEASE_TABLET | Freq: Every day | ORAL | Status: DC

## 2015-06-27 MED ORDER — ZOLPIDEM TARTRATE 5 MG PO TABS
5.0000 mg | ORAL_TABLET | Freq: Every evening | ORAL | Status: DC | PRN
Start: 2015-06-27 — End: 2015-10-08

## 2015-06-27 NOTE — Progress Notes (Signed)
Pre visit review using our clinic review tool, if applicable. No additional management support is needed unless otherwise documented below in the visit note.   Chief Complaint  Patient presents with  . Follow-up    meds  sleep , ppi gerd    HPI: Marcia Irwin 60 y.o. comes in  For fu and management of meds for  sleep travel issues  And ppi for gerd.     GI takes :protonix   Not every day    Using as needed. About a week a month   Travels tired to do lsi to avoid . No dysphagia unintended weight loss  Hx of esophagitis in past   Had trigger finger surgery  Thumb  Right  In December better    Dr Pamalee Leyden:   Christiane Ha  Etoh:  2 per week .   Ambien  Try nots to use  But travels a lot   And uses when traveling most day .  Denies  Sig se    Melatonin caused next day drowsiness taking just pre sleep .  Feels well health wise  No more cp sob exercise intolerance   No reg use of naproxyn ROS: See pertinent positives and negatives per HPI.  Past Medical History  Diagnosis Date  . GERD (gastroesophageal reflux disease) 2006    with esophagitis and gi bleed   . Heart murmur   . UTI (lower urinary tract infection)   . Tubal pregnancy 1993  . Breast cyst     aspirated followed with imaging  . Headache(784.0)     episode diplopia eval neg mri work up  . ANXIETY, SITUATIONAL 04/24/2008    Qualifier: Diagnosis of  By: Regis Bill MD, Standley Brooking   . Unspecified vitamin D deficiency 11/13/2007    Qualifier: Diagnosis of  By: Regis Bill MD, Standley Brooking   . DIPLOPIA, HX OF 04/01/2008    Qualifier: Diagnosis of  By: Regis Bill MD, Standley Brooking   . BREAST MASS 01/28/2010    Qualifier: Diagnosis of  By: Charlett Blake MD, Erline Levine    . Cough, persistent 06/20/2014    rx for bronchospasm pos pist infectious  dec airway irratilbity optimize flonase and   ppi for now  and fu if needed no hx of asthma nl exam excep dry cough sp     Family History  Problem Relation Age of Onset  . Breast cancer    . Colon cancer    .  Diabetes    . Hyperlipidemia    . Hypertension    . Stroke    . Heart disease    . Other      clots grandmother  . Colon cancer Father   . Heart attack Father   . Colon cancer Paternal Grandfather     Social History   Social History  . Marital Status: Married    Spouse Name: N/A  . Number of Children: N/A  . Years of Education: N/A   Social History Main Topics  . Smoking status: Never Smoker   . Smokeless tobacco: Never Used     Comment: rare  . Alcohol Use: 1.8 oz/week    3 Glasses of wine per week  . Drug Use: No  . Sexual Activity: Not Asked   Other Topics Concern  . None   Social History Narrative   Married   Regular exercise- no    HH of 2 except college  Age.   Pets cat and dog   Sleep  60 hours week  Travels.    Neg ets .   2-3 etoh per week.    Outpatient Prescriptions Prior to Visit  Medication Sig Dispense Refill  . Ascorbic Acid (VITAMIN C PO) Take 1 tablet by mouth daily.    . naproxen sodium (ANAPROX) 220 MG tablet Take 440 mg by mouth daily as needed (general pain).    . pantoprazole (PROTONIX) 40 MG tablet Take 1 tablet (40 mg total) by mouth daily. 90 tablet 3  . zolpidem (AMBIEN) 5 MG tablet Take 1 tablet (5 mg total) by mouth at bedtime as needed for sleep. 90 tablet 0  . ciprofloxacin (CIPRO) 500 MG tablet Take 1 tablet (500 mg total) by mouth 2 (two) times daily. 6 tablet 0  . propranolol (INDERAL) 10 MG tablet Take 1 tablet (10 mg total) by mouth daily as needed. (Patient not taking: Reported on 10/16/2014) 30 tablet 3   No facility-administered medications prior to visit.     EXAM:  BP 116/72 mmHg  Temp(Src) 98.1 F (36.7 C) (Oral)  Wt 132 lb 4.8 oz (60.011 kg)  Body mass index is 25.01 kg/(m^2).  GENERAL: vitals reviewed and listed above, alert, oriented, appears well hydrated and in no acute distress HEENT: atraumatic, conjunctiva  clear, no obvious abnormalities on inspection of external nose and ears OP : no lesion edema or  exudate  NECK: no obvious masses on inspection palpation  LUNGS: clear to auscultation bilaterally, no wheezes, rales or rhonchi, good air movement CV: HRRR, no clubbing cyanosis or  peripheral edema nl cap refill  Abdomen:  Sof,t normal bowel sounds without hepatosplenomegaly, no guarding rebound or masses no CVA tenderness MS: moves all extremities without noticeable focal  abnormality PSYCH: pleasant and cooperative, no obvious depression or anxiety Lab Results  Component Value Date   WBC 7.8 09/11/2014   HGB 13.2 09/11/2014   HCT 40.5 09/11/2014   PLT 274 09/11/2014   GLUCOSE 105* 09/11/2014   CHOL 212* 02/11/2015   TRIG 99.0 02/11/2015   HDL 44.80 02/11/2015   LDLDIRECT 151.8 10/31/2012   LDLCALC 148* 02/11/2015   ALT 16 10/31/2012   AST 15 10/31/2012   NA 141 09/11/2014   K 3.9 09/11/2014   CL 104 09/11/2014   CREATININE 0.97 09/11/2014   BUN 19 09/11/2014   CO2 26 09/11/2014   TSH 1.27 09/19/2014   HGBA1C 5.5 02/11/2015   reviewed hcm parameteres today  ASSESSMENT AND PLAN:  Discussed the following assessment and plan:  Insomnia  Gastroesophageal reflux disease, esophagitis presence not specified  Medication management  HLD (hyperlipidemia) Sees gyne yearly mostly    Plan check in 6 months  hcm seems utd   Benefit more than risk of medications  to continue. Continue lifestyle intervention healthy eating and exercise . Plan lipid check next visit .othe labs as indicated . -Patient advised to return or notify health care team  if new concerns arise. Total visit 105mins > 50% spent counseling and coordinating care as indicated in above note and in instructions to patient .   Patient Instructions  Continue lifestyle intervention healthy eating and exercise .  Will get labs  Lipids  At next visit in the fall .  ROV in    Summer     6-8 months .   Standley Brooking. Panosh M.D.

## 2015-06-27 NOTE — Patient Instructions (Signed)
Continue lifestyle intervention healthy eating and exercise .  Will get labs  Lipids  At next visit in the fall .  ROV in    Summer     6-8 months .

## 2015-06-29 ENCOUNTER — Encounter: Payer: Self-pay | Admitting: Internal Medicine

## 2015-10-02 ENCOUNTER — Telehealth: Payer: Self-pay | Admitting: Internal Medicine

## 2015-10-02 NOTE — Telephone Encounter (Signed)
Spoke to the pt.  She did not realize that her prescription was for 6 months.  She will call the pharmacy.

## 2015-10-02 NOTE — Telephone Encounter (Signed)
Last rx was 1 /13  90 with 1 refill  Should have a refill  To ge her through June July  Please clarify why early request

## 2015-10-02 NOTE — Telephone Encounter (Signed)
Pt need refill on Rx Zolpidem (ambien) 5 mg   Pharm:  [facility]

## 2015-10-07 ENCOUNTER — Telehealth: Payer: Self-pay | Admitting: Internal Medicine

## 2015-10-07 NOTE — Telephone Encounter (Signed)
Zolpidem filled for 6 months in Jan 17.  Pharmacy did not have any refills on file.  Called in the other 90.  Will need to send to local pharmacy.  May take 2 weeks for pt to receive refill from supply company.

## 2015-10-07 NOTE — Telephone Encounter (Signed)
Then i future cannot write refills  For 90 days ?  Ok to refill  Local pharmacy

## 2015-10-07 NOTE — Telephone Encounter (Signed)
Pt needs new rx zolpidem 5 mg # [facility] [address] Pt also needs new rx zolpidem 5 mg #90 [facility]. The rx  Is only good for 3 month at [facility] per pt

## 2015-10-08 MED ORDER — ZOLPIDEM TARTRATE 5 MG PO TABS: 5.0000 mg | ORAL_TABLET | Freq: Every evening | ORAL | Status: DC | PRN

## 2015-10-08 NOTE — Telephone Encounter (Signed)
Called to the pharmacy and left on machine. 

## 2016-01-01 ENCOUNTER — Telehealth: Payer: Self-pay | Admitting: General Practice

## 2016-01-02 MED ORDER — ZOLPIDEM TARTRATE 5 MG PO TABS: 5.0000 mg | ORAL_TABLET | Freq: Every evening | ORAL | Status: DC | PRN

## 2016-01-02 NOTE — Telephone Encounter (Signed)
Pt has been sch

## 2016-01-02 NOTE — Telephone Encounter (Signed)
Patient is due for  Med check OV   In next month   Refill disp 30  And plan ov before runs out.

## 2016-01-02 NOTE — Telephone Encounter (Signed)
Medication has been called to the pharmacy for 30 days.  Please help the pt to make follow up appointment.  Thanks!!

## 2016-02-04 NOTE — Progress Notes (Signed)
Pre visit review using our clinic review tool, if applicable. No additional management support is needed unless otherwise documented below in the visit note.  Chief Complaint  Patient presents with  . Follow-up    HPI: Marcia Irwin 60 y.o.   Fu meds  hcm   To do [place] lisa  Surgery   Bladder  Issues per gyn  Sleep:  Still daily   Traveling  A lot [place] last  Week   ambien 5  Still helps .  No sig se  So far ocass taked extra 1/2  Rare use of naproxyn protonix as needed and not needed  for weeks.         ROS: See pertinent positives and negatives per HPI. No cp sob  Ed visit change isn health status  Mood etc  etoh ocass neg rd  hh of 6 4 adults 2 kids  transitioning children  Past Medical History:  Diagnosis Date  . ANXIETY, SITUATIONAL 04/24/2008   Qualifier: Diagnosis of  By: Regis Bill MD, Standley Brooking   . Breast cyst    aspirated followed with imaging  . BREAST MASS 01/28/2010   Qualifier: Diagnosis of  By: Charlett Blake MD, Erline Levine    . Cough, persistent 06/20/2014   rx for bronchospasm pos pist infectious  dec airway irratilbity optimize flonase and   ppi for now  and fu if needed no hx of asthma nl exam excep dry cough sp   . DIPLOPIA, HX OF 04/01/2008   Qualifier: Diagnosis of  By: Regis Bill MD, Standley Brooking   . GERD (gastroesophageal reflux disease) 2006   with esophagitis and gi bleed   . Headache(784.0)    episode diplopia eval neg mri work up  . Heart murmur   . Tubal pregnancy 1993  . Unspecified vitamin D deficiency 11/13/2007   Qualifier: Diagnosis of  By: Regis Bill MD, Standley Brooking   . UTI (lower urinary tract infection)     Family History  Problem Relation Age of Onset  . Breast cancer    . Colon cancer    . Diabetes    . Hyperlipidemia    . Hypertension    . Stroke    . Heart disease    . Other      clots grandmother  . Colon cancer Father   . Heart attack Father   . Colon cancer Paternal Grandfather     Social History   Social History  . Marital status: Married   Spouse name: N/A  . Number of children: N/A  . Years of education: N/A   Social History Main Topics  . Smoking status: Never Smoker  . Smokeless tobacco: Never Used     Comment: rare  . Alcohol use 1.8 oz/week    3 Glasses of wine per week  . Drug use: No  . Sexual activity: Not Asked   Other Topics Concern  . None   Social History Narrative   Married   Regular exercise- no    HH of 2 except college  Age.   Pets cat and dog   Sleep   60 hours week  Travels.    Neg ets .   2-3 etoh per week.    Outpatient Medications Prior to Visit  Medication Sig Dispense Refill  . Ascorbic Acid (VITAMIN C PO) Take 1 tablet by mouth daily.    . naproxen sodium (ANAPROX) 220 MG tablet Take 440 mg by mouth daily as needed (general pain).    Marland Kitchen  pantoprazole (PROTONIX) 40 MG tablet Take 1 tablet (40 mg total) by mouth daily. 90 tablet 1  . zolpidem (AMBIEN) 5 MG tablet Take 1 tablet (5 mg total) by mouth at bedtime as needed for sleep. 30 tablet 0   No facility-administered medications prior to visit.      EXAM:  BP 120/82 (BP Location: Right Arm, Patient Position: Sitting, Cuff Size: Normal)   Temp 98.2 F (36.8 C) (Oral)   Wt 132 lb 6.4 oz (60.1 kg)   BMI 25.02 kg/m   Body mass index is 25.02 kg/m.  GENERAL: vitals reviewed and listed above, alert, oriented, appears well hydrated and in no acute distress HEENT: atraumatic, conjunctiva  clear, no obvious abnormalities on inspection of external nose and ears  NECK: no obvious masses on inspection palpation  LUNGS: clear to auscultation bilaterally, no wheezes, rales or rhonchi, good air movement CV: HRRR, no clubbing cyanosis or  peripheral edema nl cap refill  Abdomen:  Sof,t normal bowel sounds without hepatosplenomegaly, no guarding rebound or masses no CVA tenderness MS: moves all extremities without noticeable focal  abnormality PSYCH: pleasant and cooperative, no obvious depression or anxiety Lab Results  Component Value  Date   WBC 7.8 09/11/2014   HGB 13.2 09/11/2014   HCT 40.5 09/11/2014   PLT 274 09/11/2014   GLUCOSE 90 02/05/2016   CHOL 232 (H) 02/05/2016   TRIG 135.0 02/05/2016   HDL 56.70 02/05/2016   LDLDIRECT 151.8 10/31/2012   LDLCALC 148 (H) 02/05/2016   ALT 16 10/31/2012   AST 15 10/31/2012   NA 139 02/05/2016   K 3.8 02/05/2016   CL 106 02/05/2016   CREATININE 0.87 02/05/2016   BUN 20 02/05/2016   CO2 27 02/05/2016   TSH 1.27 09/19/2014   HGBA1C 5.4 02/05/2016    ASSESSMENT AND PLAN:  Discussed the following assessment and plan:  Insomnia - benefit more than risk  cont med travels internationally and other frequently job  HLD (hyperlipidemia) - rehceck fasting today lsi - Plan: Lipid panel  Hyperglycemia - recheck  lsi  - Plan: Basic metabolic panel, Hemoglobin A1c  Medication management  Need for prophylactic vaccination and inoculation against influenza - Plan: Flu Vaccine QUAD 36+ mos PF IM (Fluarix & Fluzone Quad PF)  Need for Tdap vaccination - Plan: Tdap vaccine greater than or equal to 7yo IM  -Patient advised to return or notify health care team  if symptoms worsen ,persist or new concerns arise.  Patient Instructions  Will notify you  of labs when available.  Lipids and blood sugar tests.  Continue lifestyle intervention healthy eating and exercise . Refill ambien as planned .  Flu and tdap today.  Glad you are doing well.   ROV in 6 months  For med check .     Standley Brooking. Nakenya Theall M.D.

## 2016-02-05 ENCOUNTER — Encounter: Payer: Self-pay | Admitting: Internal Medicine

## 2016-02-05 ENCOUNTER — Ambulatory Visit (INDEPENDENT_AMBULATORY_CARE_PROVIDER_SITE_OTHER): Payer: BLUE CROSS/BLUE SHIELD | Admitting: Internal Medicine

## 2016-02-05 VITALS — BP 120/82 | Temp 98.2°F | Wt 132.4 lb

## 2016-02-05 DIAGNOSIS — G4700 Insomnia, unspecified: Secondary | ICD-10-CM

## 2016-02-05 DIAGNOSIS — Z79899 Other long term (current) drug therapy: Secondary | ICD-10-CM | POA: Diagnosis not present

## 2016-02-05 DIAGNOSIS — E785 Hyperlipidemia, unspecified: Secondary | ICD-10-CM | POA: Diagnosis not present

## 2016-02-05 DIAGNOSIS — Z23 Encounter for immunization: Secondary | ICD-10-CM | POA: Diagnosis not present

## 2016-02-05 DIAGNOSIS — R739 Hyperglycemia, unspecified: Secondary | ICD-10-CM

## 2016-02-05 LAB — BASIC METABOLIC PANEL
BUN: 20 mg/dL (ref 6–23)
CALCIUM: 9 mg/dL (ref 8.4–10.5)
CO2: 27 mEq/L (ref 19–32)
CREATININE: 0.87 mg/dL (ref 0.40–1.20)
Chloride: 106 mEq/L (ref 96–112)
GFR: 70.43 mL/min (ref >60.00)
GLUCOSE: 90 mg/dL (ref 70–99)
Potassium: 3.8 mEq/L (ref 3.5–5.1)
SODIUM: 139 meq/L (ref 135–145)

## 2016-02-05 LAB — LIPID PANEL
CHOLESTEROL: 232 mg/dL — AB (ref 0–200)
HDL: 56.7 mg/dL (ref >39.00)
LDL CALC: 148 mg/dL — AB (ref 0–99)
NonHDL: 174.84
Total CHOL/HDL Ratio: 4
Triglycerides: 135 mg/dL (ref 0.0–149.0)
VLDL: 27 mg/dL (ref 0.0–40.0)

## 2016-02-05 LAB — HEMOGLOBIN A1C: Hgb A1c MFr Bld: 5.4 % (ref 4.6–6.5)

## 2016-02-05 MED ORDER — ZOLPIDEM TARTRATE 5 MG PO TABS: 5.0000 mg | ORAL_TABLET | Freq: Every evening | ORAL | 0 refills | Status: DC | PRN

## 2016-02-05 NOTE — Patient Instructions (Addendum)
Will notify you  of labs when available.  Lipids and blood sugar tests.  Continue lifestyle intervention healthy eating and exercise . Refill ambien as planned .  Flu and tdap today.  Glad you are doing well.   ROV in 6 months  For med check .

## 2016-05-15 DIAGNOSIS — J069 Acute upper respiratory infection, unspecified: Secondary | ICD-10-CM | POA: Diagnosis not present

## 2016-05-19 ENCOUNTER — Telehealth: Payer: Self-pay | Admitting: Internal Medicine

## 2016-05-19 NOTE — Telephone Encounter (Signed)
° ° ° °  Pt request refill of the following: ° °zolpidem (AMBIEN) 5 MG tablet ° ° °Phamacy: °

## 2016-05-19 NOTE — Telephone Encounter (Signed)
Ok to refill x 1 90 # 

## 2016-05-20 MED ORDER — ZOLPIDEM TARTRATE 5 MG PO TABS: 5.0000 mg | ORAL_TABLET | Freq: Every evening | ORAL | 0 refills | Status: DC | PRN

## 2016-05-20 NOTE — Telephone Encounter (Signed)
Faxed to the pharmacy.  Received confirmation that the fax was successful.

## 2016-05-28 DIAGNOSIS — R6889 Other general symptoms and signs: Secondary | ICD-10-CM | POA: Diagnosis not present

## 2016-05-28 DIAGNOSIS — B9689 Other specified bacterial agents as the cause of diseases classified elsewhere: Secondary | ICD-10-CM | POA: Diagnosis not present

## 2016-05-28 DIAGNOSIS — J029 Acute pharyngitis, unspecified: Secondary | ICD-10-CM | POA: Diagnosis not present

## 2016-05-28 DIAGNOSIS — J019 Acute sinusitis, unspecified: Secondary | ICD-10-CM | POA: Diagnosis not present

## 2016-06-04 DIAGNOSIS — B9689 Other specified bacterial agents as the cause of diseases classified elsewhere: Secondary | ICD-10-CM | POA: Diagnosis not present

## 2016-06-04 DIAGNOSIS — J019 Acute sinusitis, unspecified: Secondary | ICD-10-CM | POA: Diagnosis not present

## 2016-07-15 DIAGNOSIS — H10021 Other mucopurulent conjunctivitis, right eye: Secondary | ICD-10-CM | POA: Diagnosis not present

## 2016-11-22 ENCOUNTER — Other Ambulatory Visit: Payer: Self-pay | Admitting: Family Medicine

## 2016-11-23 MED ORDER — ZOLPIDEM TARTRATE 5 MG PO TABS: 5.0000 mg | ORAL_TABLET | Freq: Every evening | ORAL | 0 refills | Status: DC | PRN

## 2016-11-23 NOTE — Telephone Encounter (Signed)
Ok to refill x 1  Yearly OV before  Refilled again

## 2016-12-13 NOTE — Progress Notes (Signed)
Chief Complaint  Patient presents with  . Medication Management    HPI: Marcia Irwin 61 y.o. come in for Chronic disease management   chonric insomnia  Travel  and medication Still using the Ambien 5 mg for travel example recently she came back from the [place] is back to the [place] and has to go to [place] she uses the Ambien when she is "supposed to go to bed" and it helps her a good bit to reset her clock. Denies any major side effects but does watch for it Uses Protonix mostly as needed a few days a month for a few more but never weeks at a time. It seems to help her more quickly than some other remedies. No other major health concerns does ask about new shingles vaccine. Tries to get exercise walking still likes her job. Is up-to-date on tetanus shot is seen to be a grandparent in the next year.  Neg t, ocass etoh no RD    ROS: See pertinent positives and negatives per HPI.  no cp sob  Currently  Depression etc.  Past Medical History:  Diagnosis Date  . ANXIETY, SITUATIONAL 04/24/2008   Qualifier: Diagnosis of  By: Regis Bill MD, Standley Brooking   . Breast cyst    aspirated followed with imaging  . BREAST MASS 01/28/2010   Qualifier: Diagnosis of  By: Charlett Blake MD, Erline Levine    . Cough, persistent 06/20/2014   rx for bronchospasm pos pist infectious  dec airway irratilbity optimize flonase and   ppi for now  and fu if needed no hx of asthma nl exam excep dry cough sp   . DIPLOPIA, HX OF 04/01/2008   Qualifier: Diagnosis of  By: Regis Bill MD, Standley Brooking   . GERD (gastroesophageal reflux disease) 2006   with esophagitis and gi bleed   . Headache(784.0)    episode diplopia eval neg mri work up  . Heart murmur   . Tubal pregnancy 1993  . Unspecified vitamin D deficiency 11/13/2007   Qualifier: Diagnosis of  By: Regis Bill MD, Standley Brooking   . UTI (lower urinary tract infection)     Family History  Problem Relation Age of Onset  . Breast cancer Unknown   . Colon cancer Unknown   . Diabetes  Unknown   . Hyperlipidemia Unknown   . Hypertension Unknown   . Stroke Unknown   . Heart disease Unknown   . Other Unknown        clots grandmother  . Colon cancer Father   . Heart attack Father   . Colon cancer Paternal Grandfather     Social History   Social History  . Marital status: Married    Spouse name: N/A  . Number of children: N/A  . Years of education: N/A   Social History Main Topics  . Smoking status: Never Smoker  . Smokeless tobacco: Never Used     Comment: rare  . Alcohol use 1.8 oz/week    3 Glasses of wine per week  . Drug use: No  . Sexual activity: Not Asked   Other Topics Concern  . None   Social History Narrative   Married   Regular exercise- no    HH of 2 except college  Age.   Pets cat and dog   Sleep   60 hours week  Travels.    Neg ets .   2-3 etoh per week.    Outpatient Medications Prior to Visit  Medication Sig Dispense Refill  .  Ascorbic Acid (VITAMIN C PO) Take 1 tablet by mouth daily.    . naproxen sodium (ANAPROX) 220 MG tablet Take 440 mg by mouth daily as needed (general pain).    . pantoprazole (PROTONIX) 40 MG tablet Take 1 tablet (40 mg total) by mouth daily. 90 tablet 1  . zolpidem (AMBIEN) 5 MG tablet Take 1 tablet (5 mg total) by mouth at bedtime as needed for sleep. 90 tablet 0   No facility-administered medications prior to visit.      EXAM:  BP 120/78 (BP Location: Right Arm, Patient Position: Sitting, Cuff Size: Normal)   Pulse 76   Temp 98.8 F (37.1 C) (Oral)   Wt 132 lb 9.6 oz (60.1 kg)   BMI 25.05 kg/m   Body mass index is 25.05 kg/m.  GENERAL: vitals reviewed and listed above, alert, oriented, appears well hydrated and in no acute distress HEENT: atraumatic, conjunctiva  clear, no obvious abnormalities on inspection of external nose and ears OP : no lesion edema or exudate  NECK: no obvious masses on inspection palpation  LUNGS: clear to auscultation bilaterally, no wheezes, rales or rhonchi, good  air movement CV: HRRR, no clubbing cyanosis or  peripheral edema nl cap refill  MS: moves all extremities without noticeable focal  abnormality PSYCH: pleasant and cooperative, no obvious depression or anxiety Lab Results  Component Value Date   WBC 7.8 09/11/2014   HGB 13.2 09/11/2014   HCT 40.5 09/11/2014   PLT 274 09/11/2014   GLUCOSE 90 02/05/2016   CHOL 232 (H) 02/05/2016   TRIG 135.0 02/05/2016   HDL 56.70 02/05/2016   LDLDIRECT 151.8 10/31/2012   LDLCALC 148 (H) 02/05/2016   ALT 16 10/31/2012   AST 15 10/31/2012   NA 139 02/05/2016   K 3.8 02/05/2016   CL 106 02/05/2016   CREATININE 0.87 02/05/2016   BUN 20 02/05/2016   CO2 27 02/05/2016   TSH 1.27 09/19/2014   HGBA1C 5.4 02/05/2016   BP Readings from Last 3 Encounters:  12/14/16 120/78  02/05/16 120/82  06/27/15 116/72    ASSESSMENT AND PLAN:  Discussed the following assessment and plan:  Insomnia, unspecified type  Medication management  Engages in travel abroad  Gastroesophageal reflux disease, esophagitis presence not specified Using medicine appropriately has very frequent travel across times signs and medication seems to work for her. We'll continue this point benefit more than risk. 6 month med check. -Patient advised to return or notify health care team  if  new concerns arise.  Patient Instructions  Continued caution with use of medicine Gladys helpful in your travel. We'll refill your proton X as we discussed. Look into the new shingles vaccine reimbursement we can give it here or at a pharmacy depending on which her insurance reimbursements.  Continue attention to healthy lifestyle. Are OV in 6 months med check.     Standley Brooking. Panosh M.D.

## 2016-12-14 ENCOUNTER — Encounter: Payer: Self-pay | Admitting: Internal Medicine

## 2016-12-14 ENCOUNTER — Ambulatory Visit (INDEPENDENT_AMBULATORY_CARE_PROVIDER_SITE_OTHER): Payer: BLUE CROSS/BLUE SHIELD | Admitting: Internal Medicine

## 2016-12-14 VITALS — BP 120/78 | HR 76 | Temp 98.8°F | Wt 132.6 lb

## 2016-12-14 DIAGNOSIS — K219 Gastro-esophageal reflux disease without esophagitis: Secondary | ICD-10-CM

## 2016-12-14 DIAGNOSIS — Z789 Other specified health status: Secondary | ICD-10-CM

## 2016-12-14 DIAGNOSIS — Z79899 Other long term (current) drug therapy: Secondary | ICD-10-CM | POA: Diagnosis not present

## 2016-12-14 DIAGNOSIS — H25013 Cortical age-related cataract, bilateral: Secondary | ICD-10-CM | POA: Diagnosis not present

## 2016-12-14 DIAGNOSIS — H2513 Age-related nuclear cataract, bilateral: Secondary | ICD-10-CM | POA: Diagnosis not present

## 2016-12-14 DIAGNOSIS — H40013 Open angle with borderline findings, low risk, bilateral: Secondary | ICD-10-CM | POA: Diagnosis not present

## 2016-12-14 DIAGNOSIS — G4700 Insomnia, unspecified: Secondary | ICD-10-CM | POA: Diagnosis not present

## 2016-12-14 DIAGNOSIS — H04123 Dry eye syndrome of bilateral lacrimal glands: Secondary | ICD-10-CM | POA: Diagnosis not present

## 2016-12-14 DIAGNOSIS — H40011 Open angle with borderline findings, low risk, right eye: Secondary | ICD-10-CM | POA: Diagnosis not present

## 2016-12-14 DIAGNOSIS — H40012 Open angle with borderline findings, low risk, left eye: Secondary | ICD-10-CM | POA: Diagnosis not present

## 2016-12-14 MED ORDER — ZOLPIDEM TARTRATE 5 MG PO TABS: 5.0000 mg | ORAL_TABLET | Freq: Every evening | ORAL | 1 refills | Status: DC | PRN

## 2016-12-14 MED ORDER — PANTOPRAZOLE SODIUM 40 MG PO TBEC: 40.0000 mg | DELAYED_RELEASE_TABLET | Freq: Every day | ORAL | 1 refills | Status: DC

## 2016-12-14 NOTE — Patient Instructions (Addendum)
Continued caution with use of medicine Gladys helpful in your travel. We'll refill your proton X as we discussed. Look into the new shingles vaccine reimbursement we can give it here or at a pharmacy depending on which her insurance reimbursements.  Continue attention to healthy lifestyle. Are OV in 6 months med check.

## 2016-12-21 ENCOUNTER — Encounter: Payer: Self-pay | Admitting: Internal Medicine

## 2017-01-11 ENCOUNTER — Encounter: Payer: Self-pay | Admitting: Internal Medicine

## 2017-03-04 ENCOUNTER — Encounter: Payer: Self-pay | Admitting: Internal Medicine

## 2017-03-09 ENCOUNTER — Ambulatory Visit (INDEPENDENT_AMBULATORY_CARE_PROVIDER_SITE_OTHER): Payer: BLUE CROSS/BLUE SHIELD | Admitting: Adult Health

## 2017-03-09 ENCOUNTER — Encounter: Payer: Self-pay | Admitting: Adult Health

## 2017-03-09 VITALS — BP 130/80 | Temp 98.0°F | Wt 135.0 lb

## 2017-03-09 DIAGNOSIS — J0140 Acute pansinusitis, unspecified: Secondary | ICD-10-CM | POA: Diagnosis not present

## 2017-03-09 MED ORDER — DOXYCYCLINE HYCLATE 100 MG PO CAPS: 100.0000 mg | ORAL_CAPSULE | Freq: Two times a day (BID) | ORAL | 0 refills | Status: DC

## 2017-03-09 NOTE — Progress Notes (Signed)
Subjective:    Patient ID: Marcia Irwin, female    DOB: 03/26/1956, 61 y.o.   MRN: [id number]  Sinusitis  This is a new problem. The current episode started 1 to 4 weeks ago (10 days ago ). There has been no fever. Associated symptoms include congestion, coughing, ear pain, headaches, sinus pressure and a sore throat. Pertinent negatives include no shortness of breath. Past treatments include nothing. The treatment provided no relief.       Review of Systems  Constitutional: Positive for activity change.  HENT: Positive for congestion, ear pain, sinus pain, sinus pressure and sore throat.   Eyes: Negative.   Respiratory: Positive for cough. Negative for shortness of breath.   Cardiovascular: Negative.   Gastrointestinal: Negative.   Skin: Negative.   Neurological: Positive for headaches.   Past Medical History:  Diagnosis Date  . ANXIETY, SITUATIONAL 04/24/2008   Qualifier: Diagnosis of  By: Regis Bill MD, Standley Brooking   . Breast cyst    aspirated followed with imaging  . BREAST MASS 01/28/2010   Qualifier: Diagnosis of  By: Charlett Blake MD, Erline Levine    . Cough, persistent 06/20/2014   rx for bronchospasm pos pist infectious  dec airway irratilbity optimize flonase and   ppi for now  and fu if needed no hx of asthma nl exam excep dry cough sp   . DIPLOPIA, HX OF 04/01/2008   Qualifier: Diagnosis of  By: Regis Bill MD, Standley Brooking   . GERD (gastroesophageal reflux disease) 2006   with esophagitis and gi bleed   . Headache(784.0)    episode diplopia eval neg mri work up  . Heart murmur   . Tubal pregnancy 1993  . Unspecified vitamin D deficiency 11/13/2007   Qualifier: Diagnosis of  By: Regis Bill MD, Standley Brooking   . UTI (lower urinary tract infection)     Social History   Social History  . Marital status: Married    Spouse name: N/A  . Number of children: N/A  . Years of education: N/A   Occupational History  . Not on file.   Social History Main Topics  . Smoking status: Never Smoker  .  Smokeless tobacco: Never Used     Comment: rare  . Alcohol use 1.8 oz/week    3 Glasses of wine per week  . Drug use: No  . Sexual activity: Not on file   Other Topics Concern  . Not on file   Social History Narrative   Married   Regular exercise- no    HH of 2 except college  Age.   Pets cat and dog   Sleep   60 hours week  Travels.    Neg ets .   2-3 etoh per week.    Past Surgical History:  Procedure Laterality Date  . BREAST CYST ASPIRATION    . BREAST ENHANCEMENT SURGERY    . COLONOSCOPY    . DILATION AND CURETTAGE OF UTERUS     x 2  . TONSILLECTOMY    . TRIGGER FINGER RELEASE  12/16   right thumb weingold   . TUBAL LIGATION    . UPPER GASTROINTESTINAL ENDOSCOPY      Family History  Problem Relation Age of Onset  . Breast cancer Unknown   . Colon cancer Unknown   . Diabetes Unknown   . Hyperlipidemia Unknown   . Hypertension Unknown   . Stroke Unknown   . Heart disease Unknown   . Other Unknown  clots grandmother  . Colon cancer Father   . Heart attack Father   . Colon cancer Paternal Grandfather     No Known Allergies  Current Outpatient Prescriptions on File Prior to Visit  Medication Sig Dispense Refill  . Ascorbic Acid (VITAMIN C PO) Take 1 tablet by mouth daily.    . naproxen sodium (ANAPROX) 220 MG tablet Take 440 mg by mouth daily as needed (general pain).    . pantoprazole (PROTONIX) 40 MG tablet Take 1 tablet (40 mg total) by mouth daily. 90 tablet 1  . zolpidem (AMBIEN) 5 MG tablet Take 1 tablet (5 mg total) by mouth at bedtime as needed for sleep. 90 tablet 1   No current facility-administered medications on file prior to visit.     BP 130/80 (BP Location: Right Arm)   Temp 98 F (36.7 C) (Oral)   Wt 135 lb (61.2 kg)   BMI 25.51 kg/m       Objective:   Physical Exam  Constitutional: She is oriented to person, place, and time. She appears well-developed and well-nourished. No distress.  HENT:  Right Ear: Hearing,  external ear and ear canal normal. Tympanic membrane is bulging. Tympanic membrane is not erythematous.  Left Ear: Hearing, external ear and ear canal normal. Tympanic membrane is bulging. Tympanic membrane is not erythematous.  Nose: Mucosal edema and rhinorrhea present. Right sinus exhibits maxillary sinus tenderness and frontal sinus tenderness. Left sinus exhibits maxillary sinus tenderness and frontal sinus tenderness.  Mouth/Throat: Uvula is midline, oropharynx is clear and moist and mucous membranes are normal.  Cardiovascular: Normal rate, regular rhythm, normal heart sounds and intact distal pulses.  Exam reveals no gallop and no friction rub.   No murmur heard. Pulmonary/Chest: Effort normal and breath sounds normal. No respiratory distress. She has no wheezes. She has no rales. She exhibits no tenderness.  Neurological: She is alert and oriented to person, place, and time.  Skin: Skin is warm and dry. No rash noted. She is not diaphoretic. No erythema. No pallor.  Psychiatric: She has a normal mood and affect. Her behavior is normal. Judgment and thought content normal.  Nursing note and vitals reviewed.     Assessment & Plan:  1. Acute non-recurrent pansinusitis - Exam consistent with sinusitis . Will treat due to time frame and symptoms.  - doxycycline (VIBRAMYCIN) 100 MG capsule; Take 1 capsule (100 mg total) by mouth 2 (two) times daily.  Dispense: 14 capsule; Refill: 0 - Add flonase  - Follow up if no improvement in the next 2-3 years   [employer], AGNP

## 2017-03-10 ENCOUNTER — Ambulatory Visit (AMBULATORY_SURGERY_CENTER): Payer: Self-pay | Admitting: *Deleted

## 2017-03-10 VITALS — Ht 61.5 in | Wt 135.0 lb

## 2017-03-10 DIAGNOSIS — Z800 Family history of malignant neoplasm of digestive organs: Secondary | ICD-10-CM

## 2017-03-10 DIAGNOSIS — Z86010 Personal history of colonic polyps: Secondary | ICD-10-CM

## 2017-03-10 NOTE — Progress Notes (Signed)
Patient denies any allergies to eggs or soy. Patient denies any problems with anesthesia/sedation. Patient denies any oxygen use at home. Patient denies taking any diet/weight loss medications or blood thinners. Pt declined EMMI email.

## 2017-03-18 ENCOUNTER — Encounter: Payer: BLUE CROSS/BLUE SHIELD | Admitting: Internal Medicine

## 2017-05-17 ENCOUNTER — Encounter: Payer: Self-pay | Admitting: Internal Medicine

## 2017-05-17 ENCOUNTER — Ambulatory Visit (AMBULATORY_SURGERY_CENTER): Payer: BLUE CROSS/BLUE SHIELD | Admitting: Internal Medicine

## 2017-05-17 ENCOUNTER — Other Ambulatory Visit: Payer: Self-pay

## 2017-05-17 VITALS — BP 97/79 | HR 84 | Temp 97.5°F | Resp 15 | Ht 61.0 in | Wt 135.0 lb

## 2017-05-17 DIAGNOSIS — Z800 Family history of malignant neoplasm of digestive organs: Secondary | ICD-10-CM | POA: Diagnosis not present

## 2017-05-17 DIAGNOSIS — Z86010 Personal history of colonic polyps: Secondary | ICD-10-CM

## 2017-05-17 MED ORDER — SODIUM CHLORIDE 0.9 % IV SOLN: 500.0000 mL | INTRAVENOUS | Status: DC

## 2017-05-17 NOTE — Progress Notes (Signed)
Pt's states no medical or surgical changes since previsit or office visit. 

## 2017-05-17 NOTE — Patient Instructions (Addendum)
   No polyps today.  Your next routine colonoscopy should be in 5 years - 2023.  I appreciate the opportunity to care for you. Gatha Mayer, MD, FACG   YOU HAD AN ENDOSCOPIC PROCEDURE TODAY AT [facility]:   Refer to the procedure report that was given to you for any specific questions about what was found during the examination.  If the procedure report does not answer your questions, please call your gastroenterologist to clarify.  If you requested that your care partner not be given the details of your procedure findings, then the procedure report has been included in a sealed envelope for you to review at your convenience later.  YOU SHOULD EXPECT: Some feelings of bloating in the abdomen. Passage of more gas than usual.  Walking can help get rid of the air that was put into your GI tract during the procedure and reduce the bloating. If you had a lower endoscopy (such as a colonoscopy or flexible sigmoidoscopy) you may notice spotting of blood in your stool or on the toilet paper. If you underwent a bowel prep for your procedure, you may not have a normal bowel movement for a few days.  Please Note:  You might notice some irritation and congestion in your nose or some drainage.  This is from the oxygen used during your procedure.  There is no need for concern and it should clear up in a day or so.  SYMPTOMS TO REPORT IMMEDIATELY:   Following lower endoscopy (colonoscopy or flexible sigmoidoscopy):  Excessive amounts of blood in the stool  Significant tenderness or worsening of abdominal pains  Swelling of the abdomen that is new, acute  Fever of 100F or higher   For urgent or emergent issues, a gastroenterologist can be reached at any hour by calling [phone].   DIET:  We do recommend a small meal at first, but then you may proceed to your regular diet.  Drink plenty of fluids but you should avoid alcoholic beverages for 24 hours.  ACTIVITY:  You  should plan to take it easy for the rest of today and you should NOT DRIVE or use heavy machinery until tomorrow (because of the sedation medicines used during the test).    FOLLOW UP: Our staff will call the number listed on your records the next business day following your procedure to check on you and address any questions or concerns that you may have regarding the information given to you following your procedure. If we do not reach you, we will leave a message.  However, if you are feeling well and you are not experiencing any problems, there is no need to return our call.  We will assume that you have returned to your regular daily activities without incident.  If any biopsies were taken you will be contacted by phone or by letter within the next 1-3 weeks.  Please call us at [phone] if you have not heard about the biopsies in 3 weeks.    SIGNATURES/CONFIDENTIALITY: You and/or your care partner have signed paperwork which will be entered into your electronic medical record.  These signatures attest to the fact that that the information above on your After Visit Summary has been reviewed and is understood.  Full responsibility of the confidentiality of this discharge information lies with you and/or your care-partner.

## 2017-05-17 NOTE — Op Note (Signed)
[facility] Patient Name: Marcia Irwin Procedure Date: 05/17/2017 7:55 AM MRN: [id number] Endoscopist: Gatha Mayer , MD Age: 61 Referring MD:  Date of Birth: 02-Nov-1955 Gender: Female Account #: [id number] Procedure:                Colonoscopy Indications:              Surveillance: Personal history of colonic polyps                            (unknown histology) on last colonoscopy 5 years                            ago, Surveillance: Personal history of adenomatous                            polyps on last colonoscopy 5 years ago, Family                            history of colon cancer in a first-degree relative Medicines:                Propofol per Anesthesia, Monitored Anesthesia Care Procedure:                Pre-Anesthesia Assessment:                           - Prior to the procedure, a History and Physical                            was performed, and patient medications and                            allergies were reviewed. The patient's tolerance of                            previous anesthesia was also reviewed. The risks                            and benefits of the procedure and the sedation                            options and risks were discussed with the patient.                            All questions were answered, and informed consent                            was obtained. Prior Anticoagulants: The patient has                            taken no previous anticoagulant or antiplatelet                            agents. ASA Grade Assessment: II - A patient with  mild systemic disease. After reviewing the risks                            and benefits, the patient was deemed in                            satisfactory condition to undergo the procedure.                           After obtaining informed consent, the colonoscope                            was passed under direct vision. Throughout the              procedure, the patient's blood pressure, pulse, and                            oxygen saturations were monitored continuously. The                            Colonoscope was introduced through the anus and                            advanced to the the cecum, identified by                            appendiceal orifice and ileocecal valve. The                            colonoscopy was performed without difficulty. The                            patient tolerated the procedure well. The quality                            of the bowel preparation was excellent. The                            ileocecal valve, appendiceal orifice, and rectum                            were photographed. The bowel preparation used was                            Miralax. Scope In: 8:09:31 AM Scope Out: 8:22:59 AM Scope Withdrawal Time: 0 hours 9 minutes 37 seconds  Total Procedure Duration: 0 hours 13 minutes 28 seconds  Findings:                 The perianal and digital rectal examinations were                            normal.                           The entire examined colon  appeared normal on direct                            and retroflexion views. Complications:            No immediate complications. Estimated Blood Loss:     Estimated blood loss: none. Impression:               - The entire examined colon is normal on direct and                            retroflexion views.                           - No specimens collected.                           - Personal history of colonic polyps 2 adenomas                            2013 and FHx colon cancer father and paternal                            grandfather Recommendation:           - Patient has a contact number available for                            emergencies. The signs and symptoms of potential                            delayed complications were discussed with the                            patient. Return to normal activities  tomorrow.                            Written discharge instructions were provided to the                            patient.                           - Resume previous diet.                           - Continue present medications.                           - Repeat colonoscopy in 5 years for surveillance. Gatha Mayer, MD 05/17/2017 8:34:54 AM This report has been signed electronically.

## 2017-05-17 NOTE — Progress Notes (Signed)
Spontaneous respirations throughout. VSS. Resting comfortably. To PACU on room air. Report to  RN. 

## 2017-05-18 ENCOUNTER — Telehealth: Payer: Self-pay

## 2017-05-18 NOTE — Telephone Encounter (Signed)
  Follow up Call-  Call back number 05/17/2017  Post procedure Call Back phone  # [phone]  Permission to leave phone message Yes  Some recent data might be hidden     Patient questions:  Do you have a fever, pain , or abdominal swelling? No. Pain Score  0 *  Have you tolerated food without any problems? Yes.    Have you been able to return to your normal activities? Yes.    Do you have any questions about your discharge instructions: Diet   No. Medications  No. Follow up visit  No.  Do you have questions or concerns about your Care? No.  Actions: * If pain score is 4 or above: No action needed, pain <4.  Per pt no problems. maw

## 2017-05-20 DIAGNOSIS — Z13820 Encounter for screening for osteoporosis: Secondary | ICD-10-CM | POA: Diagnosis not present

## 2017-05-20 DIAGNOSIS — Z6824 Body mass index (BMI) 24.0-24.9, adult: Secondary | ICD-10-CM | POA: Diagnosis not present

## 2017-05-20 DIAGNOSIS — Z01419 Encounter for gynecological examination (general) (routine) without abnormal findings: Secondary | ICD-10-CM | POA: Diagnosis not present

## 2017-05-20 DIAGNOSIS — Z1231 Encounter for screening mammogram for malignant neoplasm of breast: Secondary | ICD-10-CM | POA: Diagnosis not present

## 2017-06-20 ENCOUNTER — Other Ambulatory Visit: Payer: Self-pay | Admitting: Internal Medicine

## 2017-06-20 NOTE — Telephone Encounter (Signed)
Ambien 5mg  refill request Last filled 12/14/16, #90 x 1 rf Pt last seen 12/14/16  Please advise Dr Regis Bill, thanks.

## 2017-06-20 NOTE — Telephone Encounter (Signed)
Copied from [place] [id number]. Topic: General - Other >> Jun 20, 2017  4:18 PM Darl Householder, RMA wrote: Reason for CRM: Medication refill request for Zolpidem 5 mg, please call pt when ready for pickup

## 2017-06-21 ENCOUNTER — Other Ambulatory Visit: Payer: Self-pay | Admitting: Internal Medicine

## 2017-06-21 NOTE — Telephone Encounter (Signed)
Ok to refill x 1 due for 6 mos  Med check

## 2017-06-22 MED ORDER — ZOLPIDEM TARTRATE 5 MG PO TABS: 5.0000 mg | ORAL_TABLET | Freq: Every evening | ORAL | 0 refills | Status: DC | PRN

## 2017-06-22 NOTE — Telephone Encounter (Signed)
Done.  Notes attached to Rx and also given to pharmacy when phoned in for OV needed.  Nothing further needed.

## 2017-07-01 DIAGNOSIS — M8580 Other specified disorders of bone density and structure, unspecified site: Secondary | ICD-10-CM | POA: Diagnosis not present

## 2017-07-01 DIAGNOSIS — M818 Other osteoporosis without current pathological fracture: Secondary | ICD-10-CM | POA: Diagnosis not present

## 2017-07-15 DIAGNOSIS — J029 Acute pharyngitis, unspecified: Secondary | ICD-10-CM | POA: Diagnosis not present

## 2017-07-15 DIAGNOSIS — J019 Acute sinusitis, unspecified: Secondary | ICD-10-CM | POA: Diagnosis not present

## 2017-07-15 DIAGNOSIS — B9689 Other specified bacterial agents as the cause of diseases classified elsewhere: Secondary | ICD-10-CM | POA: Diagnosis not present

## 2017-07-15 DIAGNOSIS — R509 Fever, unspecified: Secondary | ICD-10-CM | POA: Diagnosis not present

## 2017-07-21 DIAGNOSIS — M810 Age-related osteoporosis without current pathological fracture: Secondary | ICD-10-CM | POA: Diagnosis not present

## 2017-07-22 DIAGNOSIS — J019 Acute sinusitis, unspecified: Secondary | ICD-10-CM | POA: Diagnosis not present

## 2017-07-22 DIAGNOSIS — B9689 Other specified bacterial agents as the cause of diseases classified elsewhere: Secondary | ICD-10-CM | POA: Diagnosis not present

## 2017-09-15 ENCOUNTER — Other Ambulatory Visit: Payer: Self-pay | Admitting: Internal Medicine

## 2017-09-19 NOTE — Telephone Encounter (Signed)
Last filled 06/22/17 #90 Last seen 02/14/17 Please advise Dr Regis Bill, thanks.

## 2017-09-19 NOTE — Telephone Encounter (Signed)
Pt's husband is checking on med refill. She runs out tomorrow.

## 2017-09-20 ENCOUNTER — Telehealth: Payer: Self-pay | Admitting: Internal Medicine

## 2017-09-20 NOTE — Telephone Encounter (Signed)
Copied from [place] [phone]. Topic: Quick Communication - Rx Refill/Question >> Sep 20, 2017 12:57 PM Cleaster Corin, NT wrote: Medication: zolpidem (AMBIEN) 5 MG tablet [[id number]]  Has the patient contacted their pharmacy?yes (Agent: If no, request that the patient contact the pharmacy for the refill.) Preferred Pharmacy (with phone number or street name): [facility], [place] - [address] [place] [address] Phone: [phone] Fax: [phone]   Agent: Please be advised that RX refills may take up to 3 business days. We ask that you follow-up with your pharmacy.

## 2017-09-20 NOTE — Telephone Encounter (Signed)
Sent in  Due for ov for further refills

## 2017-09-20 NOTE — Telephone Encounter (Signed)
According to chart  Patient needs  An OV for  Further refills .Message left on VM  To call back and schedule OV with Dr Regis Bill

## 2017-09-23 NOTE — Telephone Encounter (Signed)
Pt notified via mychart Nothing further needed.

## 2017-12-19 ENCOUNTER — Other Ambulatory Visit: Payer: Self-pay | Admitting: Internal Medicine

## 2017-12-23 NOTE — Progress Notes (Signed)
Chief Complaint  Patient presents with  . Follow-up    medication    HPI: Marcia Irwin 62 y.o. come in for Chronic med  management  Sees gyne for pther care doing well   Over due for med check . Pretty ymuch every night . Using Ambien because of travel and job and sometimes very little  Still travells.  A lot in job   6- 8 hours.  Working" all the time" No tobacco  Etoh:  ocass . Exercise  Doing more  .    ROS: See pertinent positives and negatives per HPI.no cv pulm sx  depression  Past Medical History:  Diagnosis Date  . ANXIETY, SITUATIONAL 04/24/2008   Qualifier: Diagnosis of  By: Regis Bill MD, Standley Brooking   . Breast cyst    aspirated followed with imaging  . BREAST MASS 01/28/2010   Qualifier: Diagnosis of  By: Charlett Blake MD, Erline Levine    . Cough, persistent 06/20/2014   rx for bronchospasm pos pist infectious  dec airway irratilbity optimize flonase and   ppi for now  and fu if needed no hx of asthma nl exam excep dry cough sp   . DIPLOPIA, HX OF 04/01/2008   Qualifier: Diagnosis of  By: Regis Bill MD, Standley Brooking   . GERD (gastroesophageal reflux disease) 2006   with esophagitis and gi bleed   . Headache(784.0)    episode diplopia eval neg mri work up  . Tubal pregnancy 1993  . Unspecified vitamin D deficiency 11/13/2007   Qualifier: Diagnosis of  By: Regis Bill MD, Standley Brooking   . UTI (lower urinary tract infection)     Family History  Problem Relation Age of Onset  . Colon cancer Father 28  . Heart attack Father   . Breast cancer Unknown   . Colon cancer Unknown   . Diabetes Unknown   . Hyperlipidemia Unknown   . Hypertension Unknown   . Stroke Unknown   . Heart disease Unknown   . Other Unknown        clots grandmother  . Colon cancer Maternal Grandfather     Social History   Socioeconomic History  . Marital status: Married    Spouse name: Not on file  . Number of children: Not on file  . Years of education: Not on file  . Highest education level: Not on file    Occupational History  . Not on file  Social Needs  . Financial resource strain: Not on file  . Food insecurity:    Worry: Not on file    Inability: Not on file  . Transportation needs:    Medical: Not on file    Non-medical: Not on file  Tobacco Use  . Smoking status: Never Smoker  . Smokeless tobacco: Never Used  . Tobacco comment: rare  Substance and Sexual Activity  . Alcohol use: Yes    Alcohol/week: 1.8 oz    Types: 3 Glasses of wine per week  . Drug use: No  . Sexual activity: Not on file  Lifestyle  . Physical activity:    Days per week: Not on file    Minutes per session: Not on file  . Stress: Not on file  Relationships  . Social connections:    Talks on phone: Not on file    Gets together: Not on file    Attends religious service: Not on file    Active member of club or organization: Not on file    Attends meetings  of clubs or organizations: Not on file    Relationship status: Not on file  Other Topics Concern  . Not on file  Social History Narrative   Married   Regular exercise- no    HH of 2 except college  Age.   Pets cat and dog   Sleep   60 hours week  Travels.    Neg ets .   2-3 etoh per week.    Outpatient Medications Prior to Visit  Medication Sig Dispense Refill  . Acetaminophen (TYLENOL PO) Take 2 tablets by mouth as needed.    . Ascorbic Acid (VITAMIN C PO) Take 1 tablet by mouth daily.    . naproxen sodium (ANAPROX) 220 MG tablet Take 440 mg by mouth daily as needed (general pain).    . pantoprazole (PROTONIX) 40 MG tablet Take 1 tablet (40 mg total) by mouth daily. 90 tablet 1  . zolpidem (AMBIEN) 5 MG tablet Take 1 tablet (5 mg total) by mouth at bedtime as needed. Office visit needed  For further refills 90 tablet 0  . bisacodyl (DULCOLAX) 5 MG EC tablet Take 5 mg by mouth once.    . doxycycline (VIBRAMYCIN) 100 MG capsule Take 1 capsule (100 mg total) by mouth 2 (two) times daily. (Patient not taking: Reported on 05/17/2017) 14 capsule  0  . polyethylene glycol powder (MIRALAX) powder Take 1 Container by mouth once.     No facility-administered medications prior to visit.      EXAM:  BP 126/84   Pulse 91   Temp 98.5 F (36.9 C)   Wt 126 lb (57.2 kg)   BMI 23.81 kg/m   Body mass index is 23.81 kg/m.  GENERAL: vitals reviewed and listed above, alert, oriented, appears well hydrated and in no acute distress HEENT: atraumatic, conjunctiva  clear, no obvious abnormalities on inspection of external nose and ears  NECK: no obvious masses on inspection palpation  LUNGS: clear to auscultation bilaterally, no wheezes, rales or rhonchi, good air movement CV: HRRR, no clubbing cyanosis or  peripheral edema nl cap refill  MS: moves all extremities without noticeable focal  abnormality PSYCH: pleasant and cooperative, no obvious depression or anxiety  BP Readings from Last 3 Encounters:  12/26/17 126/84  05/17/17 97/79  03/09/17 130/80   ASSESSMENT AND PLAN:  Discussed the following assessment and plan:  Insomnia, unspecified type - Plan: zolpidem (AMBIEN) 5 MG tablet  Medication management - Plan: zolpidem (AMBIEN) 5 MG tablet  Disturbance in sleep behavior - Plan: zolpidem (AMBIEN) 5 MG tablet Doing better ls wise  Benefit more than risk of medications  to continue. With noted cautions   Gets preventive parameters at her [place] fu in 6 mos   She is not diabeteic  But ehr quality reports as such???? She has a family hx of dm   -Patient advised to return or notify health care team  if  new concerns arise.  Patient Instructions  6 mos    Standley Brooking. Monick Rena M.D.

## 2017-12-26 ENCOUNTER — Ambulatory Visit (INDEPENDENT_AMBULATORY_CARE_PROVIDER_SITE_OTHER): Payer: BLUE CROSS/BLUE SHIELD | Admitting: Internal Medicine

## 2017-12-26 VITALS — BP 126/84 | HR 91 | Temp 98.5°F | Wt 126.0 lb

## 2017-12-26 DIAGNOSIS — G479 Sleep disorder, unspecified: Secondary | ICD-10-CM

## 2017-12-26 DIAGNOSIS — Z79899 Other long term (current) drug therapy: Secondary | ICD-10-CM

## 2017-12-26 DIAGNOSIS — G4700 Insomnia, unspecified: Secondary | ICD-10-CM

## 2017-12-26 MED ORDER — ZOLPIDEM TARTRATE 5 MG PO TABS: 5.0000 mg | ORAL_TABLET | Freq: Every evening | ORAL | 1 refills | Status: DC | PRN

## 2017-12-26 NOTE — Patient Instructions (Signed)
6 mos

## 2017-12-28 ENCOUNTER — Encounter: Payer: Self-pay | Admitting: Internal Medicine

## 2018-03-16 DIAGNOSIS — J018 Other acute sinusitis: Secondary | ICD-10-CM | POA: Diagnosis not present

## 2018-03-16 DIAGNOSIS — J029 Acute pharyngitis, unspecified: Secondary | ICD-10-CM | POA: Diagnosis not present

## 2018-03-16 DIAGNOSIS — R509 Fever, unspecified: Secondary | ICD-10-CM | POA: Diagnosis not present

## 2018-04-28 DIAGNOSIS — R6889 Other general symptoms and signs: Secondary | ICD-10-CM | POA: Diagnosis not present

## 2018-04-28 DIAGNOSIS — R05 Cough: Secondary | ICD-10-CM | POA: Diagnosis not present

## 2018-04-28 DIAGNOSIS — J209 Acute bronchitis, unspecified: Secondary | ICD-10-CM | POA: Diagnosis not present

## 2018-04-28 DIAGNOSIS — R0981 Nasal congestion: Secondary | ICD-10-CM | POA: Diagnosis not present

## 2018-04-28 DIAGNOSIS — R6883 Chills (without fever): Secondary | ICD-10-CM | POA: Diagnosis not present

## 2018-05-23 DIAGNOSIS — Z1231 Encounter for screening mammogram for malignant neoplasm of breast: Secondary | ICD-10-CM | POA: Diagnosis not present

## 2018-05-23 DIAGNOSIS — Z6824 Body mass index (BMI) 24.0-24.9, adult: Secondary | ICD-10-CM | POA: Diagnosis not present

## 2018-05-23 DIAGNOSIS — Z01419 Encounter for gynecological examination (general) (routine) without abnormal findings: Secondary | ICD-10-CM | POA: Diagnosis not present

## 2018-06-22 ENCOUNTER — Other Ambulatory Visit: Payer: Self-pay | Admitting: Internal Medicine

## 2018-06-22 DIAGNOSIS — M1811 Unilateral primary osteoarthritis of first carpometacarpal joint, right hand: Secondary | ICD-10-CM | POA: Diagnosis not present

## 2018-06-22 DIAGNOSIS — G5601 Carpal tunnel syndrome, right upper limb: Secondary | ICD-10-CM | POA: Insufficient documentation

## 2018-06-22 DIAGNOSIS — Z79899 Other long term (current) drug therapy: Secondary | ICD-10-CM

## 2018-06-22 DIAGNOSIS — G4700 Insomnia, unspecified: Secondary | ICD-10-CM

## 2018-06-22 DIAGNOSIS — R202 Paresthesia of skin: Secondary | ICD-10-CM | POA: Diagnosis not present

## 2018-06-22 DIAGNOSIS — M79644 Pain in right finger(s): Secondary | ICD-10-CM | POA: Diagnosis not present

## 2018-06-22 DIAGNOSIS — G479 Sleep disorder, unspecified: Secondary | ICD-10-CM

## 2018-06-22 DIAGNOSIS — R200 Anesthesia of skin: Secondary | ICD-10-CM | POA: Diagnosis not present

## 2018-06-23 NOTE — Telephone Encounter (Signed)
Please advise  Last OV:12/26/17 Last refill:12/26/17

## 2018-06-26 NOTE — Telephone Encounter (Signed)
Left message for pt to call back  °

## 2018-06-26 NOTE — Telephone Encounter (Signed)
Have patient make appt  For her 6 mos med check  Before further  Refills  ( sent one in today)

## 2018-07-03 ENCOUNTER — Encounter: Payer: Self-pay | Admitting: Family Medicine

## 2018-07-03 ENCOUNTER — Ambulatory Visit (INDEPENDENT_AMBULATORY_CARE_PROVIDER_SITE_OTHER): Payer: BLUE CROSS/BLUE SHIELD | Admitting: Family Medicine

## 2018-07-03 VITALS — BP 122/72 | HR 82 | Temp 98.2°F | Ht 61.0 in | Wt 129.2 lb

## 2018-07-03 DIAGNOSIS — J101 Influenza due to other identified influenza virus with other respiratory manifestations: Secondary | ICD-10-CM

## 2018-07-03 LAB — POCT INFLUENZA A/B
Influenza A, POC: POSITIVE — AB
Influenza B, POC: NEGATIVE

## 2018-07-03 MED ORDER — GUAIFENESIN-CODEINE 100-10 MG/5ML PO SOLN: 10.0000 mL | Freq: Four times a day (QID) | ORAL | 0 refills | Status: DC | PRN

## 2018-07-03 NOTE — Progress Notes (Deleted)
.  ros

## 2018-07-03 NOTE — Patient Instructions (Signed)
I think you have the flu, but outside treatment for tamiflu.   Keep up water, honey, cool mist humidifier and flonase.  Sending in codeine cough syrup at night. Could take your tessalon pearls as well. Let us know if fever/change or worsening symptoms.

## 2018-07-03 NOTE — Progress Notes (Signed)
Patient: Marcia Irwin MRN: [id number] DOB: Jul 21, 1955 PCP: Burnis Medin, MD     Subjective:  Chief Complaint  Patient presents with  . Cough    x 4 days    HPI: The patient is a 63 y.o. female who presents today for cough x 4 days. She was coming back from [place] on Thursday and had a sore throat, but Friday she felt terrible. She was extremely achy with fever to 101 with chills. She had cough and congestion as well. Saturday she was quite bad, but Sunday she started to feel a little better with low grade fever and only mild aches. Today she is feeling better, but is just drained. No fever/chills today. She is still achy and has a cough. No shortness for breath, but can hear herself wheeze at times.  She has no asthma and is not a smoker. Luz Lex a lot for her job. She has taken nyquil and motrin.   Review of Systems  Constitutional: Positive for chills and fever.  HENT: Positive for congestion and rhinorrhea. Negative for ear pain, sinus pressure, sinus pain and sore throat.   Respiratory: Positive for cough and wheezing. Negative for shortness of breath.   Cardiovascular: Negative for chest pain and palpitations.  Gastrointestinal: Negative for abdominal pain, diarrhea, nausea and vomiting.  Musculoskeletal: Positive for arthralgias and myalgias.  Skin: Negative for rash.    Allergies Patient has No Known Allergies.  Past Medical History Patient  has a past medical history of ANXIETY, SITUATIONAL (04/24/2008), Breast cyst, BREAST MASS (01/28/2010), Cough, persistent (06/20/2014), DIPLOPIA, HX OF (04/01/2008), GERD (gastroesophageal reflux disease) (2006), Headache(784.0), Tubal pregnancy (1993), Unspecified vitamin D deficiency (11/13/2007), and UTI (lower urinary tract infection).  Surgical History Patient  has a past surgical history that includes Breast enhancement surgery; Dilation and curettage of uterus; Tubal ligation; Breast cyst aspiration; Tonsillectomy; Colonoscopy;  Upper gastrointestinal endoscopy; and Trigger finger release (12/16).  Family History Pateint's family history includes Breast cancer in her unknown relative; Colon cancer in her maternal grandfather and unknown relative; Colon cancer (age of onset: 30) in her father; Diabetes in her unknown relative; Heart attack in her father; Heart disease in her unknown relative; Hyperlipidemia in her unknown relative; Hypertension in her unknown relative; Other in her unknown relative; Stroke in her unknown relative.  Social History Patient  reports that she has never smoked. She has never used smokeless tobacco. She reports current alcohol use of about 3.0 standard drinks of alcohol per week. She reports that she does not use drugs.    Objective: Vitals:   07/03/18 1132  BP: 122/72  Pulse: 82  Temp: 98.2 F (36.8 C)  TempSrc: Oral  SpO2: 96%  Weight: 129 lb 3.2 oz (58.6 kg)  Height: 5\' 1"  (1.549 m)    Body mass index is 24.41 kg/m.  Physical Exam Vitals signs reviewed.  Constitutional:      Appearance: Normal appearance.  HENT:     Right Ear: Tympanic membrane, ear canal and external ear normal.     Left Ear: Tympanic membrane, ear canal and external ear normal.     Nose: Congestion present.     Comments: TTP over sinuses     Mouth/Throat:     Mouth: Mucous membranes are moist.     Pharynx: No oropharyngeal exudate.  Eyes:     Extraocular Movements: Extraocular movements intact.     Pupils: Pupils are equal, round, and reactive to light.  Neck:     Musculoskeletal: Normal range  of motion.  Cardiovascular:     Rate and Rhythm: Normal rate and regular rhythm.     Pulses: Normal pulses.     Heart sounds: Normal heart sounds. No murmur.  Pulmonary:     Effort: Pulmonary effort is normal. No respiratory distress.     Breath sounds: Normal breath sounds. No wheezing or rales.  Abdominal:     General: Abdomen is flat. Bowel sounds are normal.     Palpations: Abdomen is soft.   Lymphadenopathy:     Cervical: No cervical adenopathy.  Skin:    General: Skin is warm.     Capillary Refill: Capillary refill takes less than 2 seconds.  Neurological:     General: No focal deficit present.     Mental Status: She is alert and oriented to person, place, and time.     Influenza A positive.     Assessment/plan: 1. Influenza A Outside of treatment for tamiflu. Continue conservative treatment. She is on the uphill and feeling better. continue ibuprofen prn for pain, push fluids, flonase for congestion, cool mist humidifier, honey and discussed otc cough medication. Will send in codeine cough syrup for her to have at night. Drowsy precautions given. Continue good hand washing hygiene. If fever, worsening symptoms she is to return.  - POCT Influenza A/B    Return if symptoms worsen or fail to improve.     Orma Flaming, MD [place]  07/03/2018

## 2018-07-03 NOTE — Progress Notes (Deleted)
Acute Office Visit  Subjective:    Patient ID: Marcia Irwin, female    DOB: [birth year], 63 y.o.   MRN: [id number]  Chief Complaint  Patient presents with  . Cough    x 4 days    HPI Patient is in today for *** .ros Past Medical History:  Diagnosis Date  . ANXIETY, SITUATIONAL 04/24/2008   Qualifier: Diagnosis of  By: Regis Bill MD, Standley Brooking   . Breast cyst    aspirated followed with imaging  . BREAST MASS 01/28/2010   Qualifier: Diagnosis of  By: Charlett Blake MD, Erline Levine    . Cough, persistent 06/20/2014   rx for bronchospasm pos pist infectious  dec airway irratilbity optimize flonase and   ppi for now  and fu if needed no hx of asthma nl exam excep dry cough sp   . DIPLOPIA, HX OF 04/01/2008   Qualifier: Diagnosis of  By: Regis Bill MD, Standley Brooking   . GERD (gastroesophageal reflux disease) 2006   with esophagitis and gi bleed   . Headache(784.0)    episode diplopia eval neg mri work up  . Tubal pregnancy 1993  . Unspecified vitamin D deficiency 11/13/2007   Qualifier: Diagnosis of  By: Regis Bill MD, Standley Brooking   . UTI (lower urinary tract infection)     Past Surgical History:  Procedure Laterality Date  . BREAST CYST ASPIRATION    . BREAST ENHANCEMENT SURGERY    . COLONOSCOPY    . DILATION AND CURETTAGE OF UTERUS     x 2  . TONSILLECTOMY    . TRIGGER FINGER RELEASE  12/16   right thumb weingold   . TUBAL LIGATION    . UPPER GASTROINTESTINAL ENDOSCOPY      Family History  Problem Relation Age of Onset  . Colon cancer Father 38  . Heart attack Father   . Breast cancer Unknown   . Colon cancer Unknown   . Diabetes Unknown   . Hyperlipidemia Unknown   . Hypertension Unknown   . Stroke Unknown   . Heart disease Unknown   . Other Unknown        clots grandmother  . Colon cancer Maternal Grandfather     Social History   Socioeconomic History  . Marital status: Married    Spouse name: Not on file  . Number of children: Not on file  . Years of education: Not on file  .  Highest education level: Not on file  Occupational History  . Not on file  Social Needs  . Financial resource strain: Not on file  . Food insecurity:    Worry: Not on file    Inability: Not on file  . Transportation needs:    Medical: Not on file    Non-medical: Not on file  Tobacco Use  . Smoking status: Never Smoker  . Smokeless tobacco: Never Used  . Tobacco comment: rare  Substance and Sexual Activity  . Alcohol use: Yes    Alcohol/week: 3.0 standard drinks    Types: 3 Glasses of wine per week  . Drug use: No  . Sexual activity: Not on file  Lifestyle  . Physical activity:    Days per week: Not on file    Minutes per session: Not on file  . Stress: Not on file  Relationships  . Social connections:    Talks on phone: Not on file    Gets together: Not on file    Attends religious service: Not on file  Active member of club or organization: Not on file    Attends meetings of clubs or organizations: Not on file    Relationship status: Not on file  . Intimate partner violence:    Fear of current or ex partner: Not on file    Emotionally abused: Not on file    Physically abused: Not on file    Forced sexual activity: Not on file  Other Topics Concern  . Not on file  Social History Narrative   Married   Regular exercise- no    HH of 2 except college  Age.   Pets cat and dog   Sleep   60 hours week  Travels.    Neg ets .   2-3 etoh per week.    Outpatient Medications Prior to Visit  Medication Sig Dispense Refill  . Ascorbic Acid (VITAMIN C PO) Take 1 tablet by mouth daily.    . pantoprazole (PROTONIX) 40 MG tablet Take 1 tablet (40 mg total) by mouth daily. 90 tablet 1  . zolpidem (AMBIEN) 5 MG tablet TAKE 1 TABLET (5 MG TOTAL) BY MOUTH AT BEDTIME AS NEEDED. OFFICE VISIT NEEDED FOR FURTHER REFILLS 90 tablet 0  . Acetaminophen (TYLENOL PO) Take 2 tablets by mouth as needed.    . naproxen sodium (ANAPROX) 220 MG tablet Take 440 mg by mouth daily as needed  (general pain).     No facility-administered medications prior to visit.     No Known Allergies  Review of Systems  Constitutional: Positive for chills and fever.  HENT: Positive for congestion and sore throat.   Respiratory: Positive for cough and wheezing. Negative for shortness of breath.   Gastrointestinal: Positive for nausea. Negative for diarrhea and vomiting.       Objective:    Physical Exam  BP 122/72 (BP Location: Left Arm, Patient Position: Sitting, Cuff Size: Normal)   Pulse 82   Temp 98.2 F (36.8 C) (Oral)   Ht 5\' 1"  (1.549 m)   Wt 129 lb 3.2 oz (58.6 kg)   SpO2 96%   BMI 24.41 kg/m  Wt Readings from Last 3 Encounters:  07/03/18 129 lb 3.2 oz (58.6 kg)  12/26/17 126 lb (57.2 kg)  05/17/17 135 lb (61.2 kg)    Health Maintenance Due  Topic Date Due  . Hepatitis C Screening  18-Apr-1956  . PNEUMOCOCCAL POLYSACCHARIDE VACCINE AGE 60-64 HIGH RISK  05/27/1957  . FOOT EXAM  05/27/1965  . OPHTHALMOLOGY EXAM  05/27/1965  . URINE MICROALBUMIN  05/27/1965  . HIV Screening  05/27/1970  . MAMMOGRAM  11/14/2015  . HEMOGLOBIN A1C  08/07/2016  . PAP SMEAR-Modifier  11/26/2017  . INFLUENZA VACCINE  01/12/2018    There are no preventive care reminders to display for this patient.   Lab Results  Component Value Date   TSH 1.27 09/19/2014   Lab Results  Component Value Date   WBC 7.8 09/11/2014   HGB 13.2 09/11/2014   HCT 40.5 09/11/2014   MCV 87.3 09/11/2014   PLT 274 09/11/2014   Lab Results  Component Value Date   NA 139 02/05/2016   K 3.8 02/05/2016   CO2 27 02/05/2016   GLUCOSE 90 02/05/2016   BUN 20 02/05/2016   CREATININE 0.87 02/05/2016   BILITOT 0.5 10/31/2012   ALKPHOS 63 10/31/2012   AST 15 10/31/2012   ALT 16 10/31/2012   PROT 7.2 10/31/2012   ALBUMIN 4.1 10/31/2012   CALCIUM 9.0 02/05/2016   ANIONGAP 11 09/11/2014  GFR 70.43 02/05/2016   Lab Results  Component Value Date   CHOL 232 (H) 02/05/2016   Lab Results  Component  Value Date   HDL 56.70 02/05/2016   Lab Results  Component Value Date   LDLCALC 148 (H) 02/05/2016   Lab Results  Component Value Date   TRIG 135.0 02/05/2016   Lab Results  Component Value Date   CHOLHDL 4 02/05/2016   Lab Results  Component Value Date   HGBA1C 5.4 02/05/2016       Assessment & Plan:   Problem List Items Addressed This Visit    None    Visit Diagnoses    Flu-like symptoms    -  Primary   Relevant Orders   POCT Influenza A/B       No orders of the defined types were placed in this encounter.    Orma Flaming, MD

## 2018-07-14 ENCOUNTER — Ambulatory Visit (INDEPENDENT_AMBULATORY_CARE_PROVIDER_SITE_OTHER): Payer: BLUE CROSS/BLUE SHIELD

## 2018-07-14 ENCOUNTER — Ambulatory Visit (INDEPENDENT_AMBULATORY_CARE_PROVIDER_SITE_OTHER): Payer: BLUE CROSS/BLUE SHIELD | Admitting: Internal Medicine

## 2018-07-14 ENCOUNTER — Encounter: Payer: Self-pay | Admitting: Internal Medicine

## 2018-07-14 VITALS — BP 120/64 | HR 58 | Temp 99.5°F | Wt 129.2 lb

## 2018-07-14 DIAGNOSIS — G479 Sleep disorder, unspecified: Secondary | ICD-10-CM

## 2018-07-14 DIAGNOSIS — R05 Cough: Secondary | ICD-10-CM

## 2018-07-14 DIAGNOSIS — G4700 Insomnia, unspecified: Secondary | ICD-10-CM | POA: Diagnosis not present

## 2018-07-14 DIAGNOSIS — R059 Cough, unspecified: Secondary | ICD-10-CM

## 2018-07-14 DIAGNOSIS — Z79899 Other long term (current) drug therapy: Secondary | ICD-10-CM

## 2018-07-14 DIAGNOSIS — E785 Hyperlipidemia, unspecified: Secondary | ICD-10-CM

## 2018-07-14 DIAGNOSIS — H5789 Other specified disorders of eye and adnexa: Secondary | ICD-10-CM

## 2018-07-14 MED ORDER — POLYMYXIN B-TRIMETHOPRIM 10000-0.1 UNIT/ML-% OP SOLN: 1.0000 [drp] | Freq: Four times a day (QID) | OPHTHALMIC | 0 refills | Status: DC

## 2018-07-14 MED ORDER — GUAIFENESIN-CODEINE 100-10 MG/5ML PO SOLN: 10.0000 mL | Freq: Four times a day (QID) | ORAL | 0 refills | Status: DC | PRN

## 2018-07-14 MED ORDER — ZOLPIDEM TARTRATE 5 MG PO TABS: 5.0000 mg | ORAL_TABLET | Freq: Every evening | ORAL | 0 refills | Status: DC | PRN

## 2018-07-14 NOTE — Progress Notes (Signed)
Chief Complaint  Patient presents with  . Follow-up    pt is here to get refill on amibien.  . Cough    Pt states she was diagnosed with flu 2 weeks ago and has a cough and wheezing in chest. Is coughing up yellow mucus and has a headache and sinus pressure     HPI: Marcia Irwin 63 y.o. come in for medication management refill of her Ambien which she uses most nights as she travels with her job.  Takes a half often.  Still seems to work for her. She is also recovering from influenza A seen by Dr. Aletha Halim of January 20.  She has been sick for about 2 weeks still feels a little chilled but is a lot better than at the onset.  Has a continued cough occasional phlegm question shortness of breath. Asks about refill of the codeine cough medicine she also has some crusting on the medial part of her eyes at times without redness or pain is still using her make-up. Mild sinus congestion and pressure.  Feels like has some noise in her chest no hemoptysis. [facility].   Gets yearly check. ROS: See pertinent positives and negatives per HPI.  Past Medical History:  Diagnosis Date  . ANXIETY, SITUATIONAL 04/24/2008   Qualifier: Diagnosis of  By: Regis Bill MD, Standley Brooking   . Breast cyst    aspirated followed with imaging  . BREAST MASS 01/28/2010   Qualifier: Diagnosis of  By: Charlett Blake MD, Erline Levine    . Cough, persistent 06/20/2014   rx for bronchospasm pos pist infectious  dec airway irratilbity optimize flonase and   ppi for now  and fu if needed no hx of asthma nl exam excep dry cough sp   . DIPLOPIA, HX OF 04/01/2008   Qualifier: Diagnosis of  By: Regis Bill MD, Standley Brooking   . GERD (gastroesophageal reflux disease) 2006   with esophagitis and gi bleed   . Headache(784.0)    episode diplopia eval neg mri work up  . Tubal pregnancy 1993  . Unspecified vitamin D deficiency 11/13/2007   Qualifier: Diagnosis of  By: Regis Bill MD, Standley Brooking   . UTI (lower urinary tract infection)     Family History  Problem  Relation Age of Onset  . Colon cancer Father 48  . Heart attack Father   . Breast cancer Unknown   . Colon cancer Unknown   . Diabetes Unknown   . Hyperlipidemia Unknown   . Hypertension Unknown   . Stroke Unknown   . Heart disease Unknown   . Other Unknown        clots grandmother  . Colon cancer Maternal Grandfather     Social History   Socioeconomic History  . Marital status: Married    Spouse name: Not on file  . Number of children: Not on file  . Years of education: Not on file  . Highest education level: Not on file  Occupational History  . Not on file  Social Needs  . Financial resource strain: Not on file  . Food insecurity:    Worry: Not on file    Inability: Not on file  . Transportation needs:    Medical: Not on file    Non-medical: Not on file  Tobacco Use  . Smoking status: Never Smoker  . Smokeless tobacco: Never Used  . Tobacco comment: rare  Substance and Sexual Activity  . Alcohol use: Yes    Alcohol/week: 3.0 standard drinks  Types: 3 Glasses of wine per week  . Drug use: No  . Sexual activity: Not on file  Lifestyle  . Physical activity:    Days per week: Not on file    Minutes per session: Not on file  . Stress: Not on file  Relationships  . Social connections:    Talks on phone: Not on file    Gets together: Not on file    Attends religious service: Not on file    Active member of club or organization: Not on file    Attends meetings of clubs or organizations: Not on file    Relationship status: Not on file  Other Topics Concern  . Not on file  Social History Narrative   Married   Regular exercise- no    HH of 2 except college  Age.   Pets cat and dog   Sleep   60 hours week  Travels.    Neg ets .   2-3 etoh per week.    Outpatient Medications Prior to Visit  Medication Sig Dispense Refill  . Ascorbic Acid (VITAMIN C PO) Take 1 tablet by mouth daily.    . pantoprazole (PROTONIX) 40 MG tablet Take 1 tablet (40 mg total) by  mouth daily. 90 tablet 1  . guaiFENesin-codeine 100-10 MG/5ML syrup Take 10 mLs by mouth every 6 (six) hours as needed for cough. 120 mL 0  . zolpidem (AMBIEN) 5 MG tablet TAKE 1 TABLET (5 MG TOTAL) BY MOUTH AT BEDTIME AS NEEDED. OFFICE VISIT NEEDED FOR FURTHER REFILLS 90 tablet 0   No facility-administered medications prior to visit.      EXAM:  BP 120/64 (BP Location: Right Arm, Patient Position: Sitting, Cuff Size: Normal)   Pulse (!) 58   Temp 99.5 F (37.5 C) (Oral)   Wt 129 lb 3.2 oz (58.6 kg)   BMI 24.41 kg/m   Body mass index is 24.41 kg/m.  GENERAL: vitals reviewed and listed above, alert, oriented, appears well hydrated and in no acute distress HEENT: atraumatic, conjunctiva  Clear has on eye ake up  tms clear , no obvious abnormalities on inspection of external nose and ears nose congested OP : no lesion edema or exudate  NECK: no obvious masses on inspection palpation  LUNGS: clear to auscultation bilaterally, no wheezes, rales or rhonchi, ? Air right base no wheezing CV: HRRR, no clubbing cyanosis or  peripheral edema nl cap refill  Abdomen:  Sof,t normal bowel sounds without hepatosplenomegaly, no guarding rebound or masses no CVA tenderness  MS: moves all extremities without noticeable focal  abnormality PSYCH: pleasant and cooperative, no obvious depression or anxiety Lab Results  Component Value Date   WBC 7.8 09/11/2014   HGB 13.2 09/11/2014   HCT 40.5 09/11/2014   PLT 274 09/11/2014   GLUCOSE 90 02/05/2016   CHOL 232 (H) 02/05/2016   TRIG 135.0 02/05/2016   HDL 56.70 02/05/2016   LDLDIRECT 151.8 10/31/2012   LDLCALC 148 (H) 02/05/2016   ALT 16 10/31/2012   AST 15 10/31/2012   NA 139 02/05/2016   K 3.8 02/05/2016   CL 106 02/05/2016   CREATININE 0.87 02/05/2016   BUN 20 02/05/2016   CO2 27 02/05/2016   TSH 1.27 09/19/2014   HGBA1C 5.4 02/05/2016   BP Readings from Last 3 Encounters:  07/14/18 120/64  07/03/18 122/72  12/26/17 126/84   Wt  Readings from Last 3 Encounters:  07/14/18 129 lb 3.2 oz (58.6 kg)  07/03/18 129  lb 3.2 oz (58.6 kg)  12/26/17 126 lb (57.2 kg)    ASSESSMENT AND PLAN:  Discussed the following assessment and plan:  Insomnia, unspecified type  Medication management  Disturbance in sleep behavior  Cough - convalescing flu  cxray neg  follow for complications  2 weeks out sx rx and fu if relapsing or alarm sx   - Plan: DG Chest 2 View  Eye discharge - no contacts compresses and if worse can add eye driops but conservativ rx first  Can do lab in future when well  with shingles vaccine.  Will do lipids bmp etc   -Patient advised to return or notify health care team  if  new concerns arise.  Patient Instructions  Refill med   Can get shingles vaccine  When feeling better for injection only .   ROV in 6 months  Or as needed  We can do labs pre visit  Such as cholesterol  And blood sugar         Mariann Laster K. Panosh M.D.

## 2018-07-14 NOTE — Patient Instructions (Signed)
Refill med   Can get shingles vaccine  When feeling better for injection only .   ROV in 6 months  Or as needed  We can do labs pre visit  Such as cholesterol  And blood sugar

## 2018-08-01 ENCOUNTER — Other Ambulatory Visit: Payer: BLUE CROSS/BLUE SHIELD

## 2018-08-01 ENCOUNTER — Telehealth: Payer: Self-pay | Admitting: *Deleted

## 2018-08-01 ENCOUNTER — Ambulatory Visit (INDEPENDENT_AMBULATORY_CARE_PROVIDER_SITE_OTHER): Payer: BLUE CROSS/BLUE SHIELD | Admitting: *Deleted

## 2018-08-01 DIAGNOSIS — Z23 Encounter for immunization: Secondary | ICD-10-CM

## 2018-08-01 DIAGNOSIS — Z79899 Other long term (current) drug therapy: Secondary | ICD-10-CM

## 2018-08-01 DIAGNOSIS — E785 Hyperlipidemia, unspecified: Secondary | ICD-10-CM

## 2018-08-01 NOTE — Telephone Encounter (Signed)
Per Dr.Panosh okay for pt to get done today

## 2018-08-01 NOTE — Telephone Encounter (Signed)
Copied from [place] [id number]. Topic: Appointment Scheduling - Scheduling Inquiry for Clinic >> Aug 01, 2018  3:16 PM Bea Graff, NT wrote: Reason for CRM: Pt would like a call to schedule a shingles vaccine and to have a lipid panel done. Please advise.

## 2018-08-01 NOTE — Progress Notes (Signed)
Per orders of Dr. Regis Bill, injection of Shingrix #1 given by Virl Cagey. Patient tolerated injection well.

## 2018-08-01 NOTE — Telephone Encounter (Signed)
Labs done today.  shingrix given today.  Nothing further needed.

## 2018-08-01 NOTE — Telephone Encounter (Signed)
Labs can be done  Now if she wishes  Was jsut so done before next visit so we can review

## 2018-08-01 NOTE — Telephone Encounter (Signed)
Pt is coming today for Shingrix #1 Pt is requesting to have her lipid panel checked as well.   I advised that per OV notes Dr Regis Bill stated that she wants labs checked prior to 6 month ROV (12/2018) Pt states that she was under the impression that she was supposed to have lipids checked now and then recheck in 6 months. I do not se this mentioned anywhere in her notes.   Will send to Dr Regis Bill to advise. Pt is coming in today at 4pm for injection and would like an answer then.

## 2018-08-02 LAB — CBC WITH DIFFERENTIAL/PLATELET
Basophils Absolute: 0.1 10*3/uL (ref 0.0–0.1)
Basophils Relative: 0.9 % (ref 0.0–3.0)
EOS ABS: 0.1 10*3/uL (ref 0.0–0.7)
Eosinophils Relative: 1.4 % (ref 0.0–5.0)
HCT: 36.6 % (ref 36.0–46.0)
Hemoglobin: 12.4 g/dL (ref 12.0–15.0)
Lymphocytes Relative: 45 % (ref 12.0–46.0)
Lymphs Abs: 2.9 10*3/uL (ref 0.7–4.0)
MCHC: 33.9 g/dL (ref 30.0–36.0)
MCV: 86.5 fl (ref 78.0–100.0)
Monocytes Absolute: 0.4 10*3/uL (ref 0.1–1.0)
Monocytes Relative: 6 % (ref 3.0–12.0)
Neutro Abs: 3 10*3/uL (ref 1.4–7.7)
Neutrophils Relative %: 46.7 % (ref 43.0–77.0)
Platelets: 243 10*3/uL (ref 150.0–400.0)
RBC: 4.23 Mil/uL (ref 3.87–5.11)
RDW: 13.8 % (ref 11.5–15.5)
WBC: 6.4 10*3/uL (ref 4.0–10.5)

## 2018-08-02 LAB — BASIC METABOLIC PANEL
BUN: 21 mg/dL (ref 6–23)
CHLORIDE: 107 meq/L (ref 96–112)
CO2: 26 mEq/L (ref 19–32)
Calcium: 8.9 mg/dL (ref 8.4–10.5)
Creatinine, Ser: 0.82 mg/dL (ref 0.40–1.20)
GFR: 70.37 mL/min (ref >60.00)
Glucose, Bld: 93 mg/dL (ref 70–99)
Potassium: 4.2 mEq/L (ref 3.5–5.1)
Sodium: 141 mEq/L (ref 135–145)

## 2018-08-02 LAB — LIPID PANEL
CHOL/HDL RATIO: 4
CHOLESTEROL: 201 mg/dL — AB (ref 0–200)
HDL: 50.7 mg/dL (ref >39.00)
NonHDL: 150.36
Triglycerides: 212 mg/dL — ABNORMAL HIGH (ref 0.0–149.0)
VLDL: 42.4 mg/dL — ABNORMAL HIGH (ref 0.0–40.0)

## 2018-08-02 LAB — LDL CHOLESTEROL, DIRECT: Direct LDL: 128 mg/dL

## 2018-08-03 ENCOUNTER — Encounter: Payer: Self-pay | Admitting: Internal Medicine

## 2018-08-03 DIAGNOSIS — H2513 Age-related nuclear cataract, bilateral: Secondary | ICD-10-CM | POA: Diagnosis not present

## 2018-08-03 DIAGNOSIS — H25013 Cortical age-related cataract, bilateral: Secondary | ICD-10-CM | POA: Diagnosis not present

## 2018-08-03 DIAGNOSIS — H47012 Ischemic optic neuropathy, left eye: Secondary | ICD-10-CM | POA: Diagnosis not present

## 2018-08-03 DIAGNOSIS — H40013 Open angle with borderline findings, low risk, bilateral: Secondary | ICD-10-CM | POA: Diagnosis not present

## 2018-08-03 DIAGNOSIS — H04123 Dry eye syndrome of bilateral lacrimal glands: Secondary | ICD-10-CM | POA: Diagnosis not present

## 2018-09-18 ENCOUNTER — Other Ambulatory Visit: Payer: Self-pay | Admitting: Internal Medicine

## 2018-09-18 NOTE — Telephone Encounter (Signed)
Please advise on refill request

## 2018-09-19 NOTE — Telephone Encounter (Signed)
Please advise thank you

## 2018-09-20 MED ORDER — ZOLPIDEM TARTRATE 5 MG PO TABS: 5.0000 mg | ORAL_TABLET | Freq: Every evening | ORAL | 0 refills | Status: DC | PRN

## 2018-10-31 DIAGNOSIS — L821 Other seborrheic keratosis: Secondary | ICD-10-CM | POA: Diagnosis not present

## 2018-10-31 DIAGNOSIS — L814 Other melanin hyperpigmentation: Secondary | ICD-10-CM | POA: Diagnosis not present

## 2018-10-31 DIAGNOSIS — I781 Nevus, non-neoplastic: Secondary | ICD-10-CM | POA: Diagnosis not present

## 2018-10-31 DIAGNOSIS — L820 Inflamed seborrheic keratosis: Secondary | ICD-10-CM | POA: Diagnosis not present

## 2018-10-31 DIAGNOSIS — D485 Neoplasm of uncertain behavior of skin: Secondary | ICD-10-CM | POA: Diagnosis not present

## 2018-10-31 DIAGNOSIS — L578 Other skin changes due to chronic exposure to nonionizing radiation: Secondary | ICD-10-CM | POA: Diagnosis not present

## 2018-10-31 DIAGNOSIS — C44712 Basal cell carcinoma of skin of right lower limb, including hip: Secondary | ICD-10-CM | POA: Diagnosis not present

## 2018-11-23 DIAGNOSIS — H43392 Other vitreous opacities, left eye: Secondary | ICD-10-CM | POA: Diagnosis not present

## 2018-11-23 DIAGNOSIS — H5319 Other subjective visual disturbances: Secondary | ICD-10-CM | POA: Diagnosis not present

## 2018-11-23 DIAGNOSIS — H43813 Vitreous degeneration, bilateral: Secondary | ICD-10-CM | POA: Diagnosis not present

## 2018-12-19 ENCOUNTER — Encounter: Payer: Self-pay | Admitting: Internal Medicine

## 2018-12-19 ENCOUNTER — Ambulatory Visit (INDEPENDENT_AMBULATORY_CARE_PROVIDER_SITE_OTHER): Payer: BC Managed Care – PPO | Admitting: Internal Medicine

## 2018-12-19 ENCOUNTER — Other Ambulatory Visit: Payer: Self-pay

## 2018-12-19 DIAGNOSIS — Z79899 Other long term (current) drug therapy: Secondary | ICD-10-CM | POA: Diagnosis not present

## 2018-12-19 DIAGNOSIS — G4700 Insomnia, unspecified: Secondary | ICD-10-CM | POA: Diagnosis not present

## 2018-12-19 MED ORDER — ZOLPIDEM TARTRATE 5 MG PO TABS: 5.0000 mg | ORAL_TABLET | Freq: Every evening | ORAL | 1 refills | Status: DC | PRN

## 2018-12-19 NOTE — Progress Notes (Signed)
Virtual Visit via Video Note  I connected with@ on 12/19/18 at  3:15 PM EDT by a video enabled telemedicine application and verified that I am speaking with the correct person using two identifiers. Location patient: home Location provider:work  office Persons participating in the virtual visit: patient, provider  [place] national recommendations  regarding COVID 19 pandemic   video visit is advised over in office visit for this patient.  Patient aware  of the limitations of evaluation and management by telemedicine and  availability of in person appointments. and agreed to proceed.   HPI: Marcia Irwin presents for video visit  6 mos med check No significant change in her  health status .  She has on gogin insomnia  When travels and now working extra hours with covid  . 50 - 60 per week but doing well.  Taking ambien nightly and still works No tobacco min etoh less exercise  But ok  ROS: See pertinent positives and negatives per HPI. Not taking  ppi for now not needed  Only prn   Past Medical History:  Diagnosis Date  . ANXIETY, SITUATIONAL 04/24/2008   Qualifier: Diagnosis of  By: Regis Bill MD, Standley Brooking   . Breast cyst    aspirated followed with imaging  . BREAST MASS 01/28/2010   Qualifier: Diagnosis of  By: Charlett Blake MD, Erline Levine    . Cough, persistent 06/20/2014   rx for bronchospasm pos pist infectious  dec airway irratilbity optimize flonase and   ppi for now  and fu if needed no hx of asthma nl exam excep dry cough sp   . DIPLOPIA, HX OF 04/01/2008   Qualifier: Diagnosis of  By: Regis Bill MD, Standley Brooking   . GERD (gastroesophageal reflux disease) 2006   with esophagitis and gi bleed   . Headache(784.0)    episode diplopia eval neg mri work up  . Tubal pregnancy 1993  . Unspecified vitamin D deficiency 11/13/2007   Qualifier: Diagnosis of  By: Regis Bill MD, Standley Brooking   . UTI (lower urinary tract infection)     Past Surgical History:  Procedure Laterality Date  . BREAST CYST ASPIRATION    .  BREAST ENHANCEMENT SURGERY    . COLONOSCOPY    . DILATION AND CURETTAGE OF UTERUS     x 2  . TONSILLECTOMY    . TRIGGER FINGER RELEASE  12/16   right thumb weingold   . TUBAL LIGATION    . UPPER GASTROINTESTINAL ENDOSCOPY      Family History  Problem Relation Age of Onset  . Colon cancer Father 6  . Heart attack Father   . Breast cancer Unknown   . Colon cancer Unknown   . Diabetes Unknown   . Hyperlipidemia Unknown   . Hypertension Unknown   . Stroke Unknown   . Heart disease Unknown   . Other Unknown        clots grandmother  . Colon cancer Maternal Grandfather     Social History   Tobacco Use  . Smoking status: Never Smoker  . Smokeless tobacco: Never Used  . Tobacco comment: rare  Substance Use Topics  . Alcohol use: Yes    Alcohol/week: 3.0 standard drinks    Types: 3 Glasses of wine per week  . Drug use: No      Current Outpatient Medications:  .  Ascorbic Acid (VITAMIN C PO), Take 1 tablet by mouth daily., Disp: , Rfl:  .  guaiFENesin-codeine 100-10 MG/5ML syrup, Take 10 mLs  by mouth every 6 (six) hours as needed for cough., Disp: 120 mL, Rfl: 0 .  pantoprazole (PROTONIX) 40 MG tablet, Take 1 tablet (40 mg total) by mouth daily., Disp: 90 tablet, Rfl: 1 .  trimethoprim-polymyxin b (POLYTRIM) ophthalmic solution, Place 1 drop into both eyes every 6 (six) hours., Disp: 10 mL, Rfl: 0 .  zolpidem (AMBIEN) 5 MG tablet, Take 1 tablet (5 mg total) by mouth at bedtime as needed. Dx insomnia, Disp: 90 tablet, Rfl: 1  EXAM: BP Readings from Last 3 Encounters:  07/14/18 120/64  07/03/18 122/72  12/26/17 126/84    VITALS per patient if applicable: reports weight  126.5  GENERAL: alert, oriented, appears well and in no acute distress HEENT: atraumatic, conjunttiva clear, no obvious abnormalities on inspection of external nose and ears NECK: normal movements of the head and neck LUNGS: on inspection no signs of respiratory distress, breathing rate appears  normal, no obvious gross SOB, gasping or wheezing CV: no obvious cyanosis  PSYCH/NEURO: pleasant and cooperative, no obvious depression or anxiety, speech and thought processing grossly intact Lab Results  Component Value Date   WBC 6.4 08/01/2018   HGB 12.4 08/01/2018   HCT 36.6 08/01/2018   PLT 243.0 08/01/2018   GLUCOSE 93 08/01/2018   CHOL 201 (H) 08/01/2018   TRIG 212.0 (H) 08/01/2018   HDL 50.70 08/01/2018   LDLDIRECT 128.0 08/01/2018   LDLCALC 148 (H) 02/05/2016   ALT 16 10/31/2012   AST 15 10/31/2012   NA 141 08/01/2018   K 4.2 08/01/2018   CL 107 08/01/2018   CREATININE 0.82 08/01/2018   BUN 21 08/01/2018   CO2 26 08/01/2018   TSH 1.27 09/19/2014   HGBA1C 5.4 02/05/2016    ASSESSMENT AND PLAN:  Discussed the following assessment and plan:    ICD-10-CM   1. Insomnia, unspecified type  G47.00   2. Medication management  Z79.899    Benefit more than risk of medications  to continue. Refill  X 6 mos and 6 mos med or person cpx check   Counseled.   Cont attention to lifestyle sleep etc .   Expectant management and discussion of plan and treatment with opportunity to ask questions and all were answered. The patient agreed with the plan and demonstrated an understanding of the instructions.   Advised to call back or seek an in-person evaluation if worsening  or having  further concerns . In interim  . Shanon Ace, MD

## 2018-12-20 ENCOUNTER — Ambulatory Visit (INDEPENDENT_AMBULATORY_CARE_PROVIDER_SITE_OTHER): Payer: BC Managed Care – PPO | Admitting: *Deleted

## 2018-12-20 DIAGNOSIS — Z23 Encounter for immunization: Secondary | ICD-10-CM | POA: Diagnosis not present

## 2018-12-20 NOTE — Progress Notes (Signed)
Per orders of Dr. Regis Bill, injection of Shingrix given by Westley Hummer. Patient tolerated injection well.

## 2018-12-22 ENCOUNTER — Ambulatory Visit: Payer: BLUE CROSS/BLUE SHIELD

## 2019-01-16 DIAGNOSIS — H43392 Other vitreous opacities, left eye: Secondary | ICD-10-CM | POA: Diagnosis not present

## 2019-01-16 DIAGNOSIS — H5319 Other subjective visual disturbances: Secondary | ICD-10-CM | POA: Diagnosis not present

## 2019-01-16 DIAGNOSIS — H04123 Dry eye syndrome of bilateral lacrimal glands: Secondary | ICD-10-CM | POA: Diagnosis not present

## 2019-01-16 DIAGNOSIS — H43813 Vitreous degeneration, bilateral: Secondary | ICD-10-CM | POA: Diagnosis not present

## 2019-02-20 DIAGNOSIS — C44519 Basal cell carcinoma of skin of other part of trunk: Secondary | ICD-10-CM | POA: Diagnosis not present

## 2019-03-12 DIAGNOSIS — C44519 Basal cell carcinoma of skin of other part of trunk: Secondary | ICD-10-CM | POA: Diagnosis not present

## 2019-05-03 DIAGNOSIS — Z03818 Encounter for observation for suspected exposure to other biological agents ruled out: Secondary | ICD-10-CM | POA: Diagnosis not present

## 2019-05-30 DIAGNOSIS — Z6824 Body mass index (BMI) 24.0-24.9, adult: Secondary | ICD-10-CM | POA: Diagnosis not present

## 2019-05-30 DIAGNOSIS — Z01419 Encounter for gynecological examination (general) (routine) without abnormal findings: Secondary | ICD-10-CM | POA: Diagnosis not present

## 2019-05-30 DIAGNOSIS — Z1231 Encounter for screening mammogram for malignant neoplasm of breast: Secondary | ICD-10-CM | POA: Diagnosis not present

## 2019-05-30 DIAGNOSIS — Z13820 Encounter for screening for osteoporosis: Secondary | ICD-10-CM | POA: Diagnosis not present

## 2019-06-14 ENCOUNTER — Other Ambulatory Visit: Payer: Self-pay | Admitting: Internal Medicine

## 2019-06-14 NOTE — Telephone Encounter (Signed)
Last Rx given on 7/7 for #90 with 1 ref

## 2019-06-18 NOTE — Telephone Encounter (Signed)
  Will refill 90 days   Due for med check   Visit  Before runs out   For further refills

## 2019-07-31 DIAGNOSIS — M810 Age-related osteoporosis without current pathological fracture: Secondary | ICD-10-CM | POA: Diagnosis not present

## 2019-08-07 DIAGNOSIS — M810 Age-related osteoporosis without current pathological fracture: Secondary | ICD-10-CM | POA: Diagnosis not present

## 2019-08-22 DIAGNOSIS — Z03818 Encounter for observation for suspected exposure to other biological agents ruled out: Secondary | ICD-10-CM | POA: Diagnosis not present

## 2019-09-11 ENCOUNTER — Other Ambulatory Visit: Payer: Self-pay | Admitting: Internal Medicine

## 2019-09-17 NOTE — Telephone Encounter (Signed)
Please get her on schedule for in person visit before refill runs out   And I will refill for 90 days

## 2019-09-17 NOTE — Telephone Encounter (Signed)
Last OV 12/19/2018 Last filled 06/18/2019, # 90 with 0 refills

## 2019-10-25 DIAGNOSIS — L814 Other melanin hyperpigmentation: Secondary | ICD-10-CM | POA: Diagnosis not present

## 2019-10-25 DIAGNOSIS — C44519 Basal cell carcinoma of skin of other part of trunk: Secondary | ICD-10-CM | POA: Diagnosis not present

## 2019-11-08 DIAGNOSIS — C44519 Basal cell carcinoma of skin of other part of trunk: Secondary | ICD-10-CM | POA: Diagnosis not present

## 2019-12-13 ENCOUNTER — Other Ambulatory Visit: Payer: Self-pay | Admitting: Internal Medicine

## 2019-12-13 NOTE — Telephone Encounter (Signed)
Last OV 12/19/2018, No future OV  Last filled 09/17/2019, # 90 with 0 refills

## 2019-12-13 NOTE — Telephone Encounter (Signed)
Need OV or virtual   Will send oin 30 days in interim

## 2019-12-14 NOTE — Telephone Encounter (Signed)
Called patient and scheduled a virtual visit on 12/19/19 at 3:30pm. Patient verbalized an understanding.

## 2019-12-19 ENCOUNTER — Telehealth (INDEPENDENT_AMBULATORY_CARE_PROVIDER_SITE_OTHER): Payer: BC Managed Care – PPO | Admitting: Internal Medicine

## 2019-12-19 ENCOUNTER — Other Ambulatory Visit: Payer: Self-pay

## 2019-12-19 ENCOUNTER — Telehealth: Payer: BC Managed Care – PPO | Admitting: Internal Medicine

## 2019-12-19 DIAGNOSIS — G4700 Insomnia, unspecified: Secondary | ICD-10-CM

## 2019-12-19 DIAGNOSIS — Z79899 Other long term (current) drug therapy: Secondary | ICD-10-CM

## 2019-12-19 NOTE — Progress Notes (Signed)
No show for virtual appt

## 2019-12-20 ENCOUNTER — Telehealth: Payer: Self-pay

## 2019-12-20 ENCOUNTER — Encounter: Payer: Self-pay | Admitting: Internal Medicine

## 2019-12-20 ENCOUNTER — Other Ambulatory Visit: Payer: Self-pay

## 2019-12-20 ENCOUNTER — Telehealth (INDEPENDENT_AMBULATORY_CARE_PROVIDER_SITE_OTHER): Payer: BC Managed Care – PPO | Admitting: Internal Medicine

## 2019-12-20 VITALS — BP 118/68 | Temp 97.7°F | Ht 61.0 in | Wt 128.4 lb

## 2019-12-20 DIAGNOSIS — Z79899 Other long term (current) drug therapy: Secondary | ICD-10-CM

## 2019-12-20 DIAGNOSIS — G4700 Insomnia, unspecified: Secondary | ICD-10-CM | POA: Diagnosis not present

## 2019-12-20 DIAGNOSIS — M810 Age-related osteoporosis without current pathological fracture: Secondary | ICD-10-CM | POA: Diagnosis not present

## 2019-12-20 NOTE — Progress Notes (Signed)
Virtual Visit via Video Note  I connected with@ on 12/20/19 at  9:30 AM EDT by a video enabled telemedicine application and verified that I am speaking with the correct person using two identifiers. Location patient: home Location provider: home office Persons participating in the virtual visit: patient, provider  [place] national recommendations  regarding COVID 19 pandemic   video visit is advised over in office visit for this patient.  Patient aware  of the limitations of evaluation and management by telemedicine and  availability of in person appointments. and agreed to proceed.   HPI: Marcia Irwin presents for video visit yearly med check  Doing well; no major change in health  Sees Linda Hedges and now on prolia .   Takes protonix only as needed and some vit C  Taking ambien every night  Still works well and denies sig se memory . Neg td ,ocass etoh  Less travel with covid shut down but still can work 15 hour days  ocass exercise    Dr weaver  iop follow and cataract  Follow  ROS: See pertinent positives and negatives per HPI.  Past Medical History:  Diagnosis Date  . ANXIETY, SITUATIONAL 04/24/2008   Qualifier: Diagnosis of  By: Regis Bill MD, Standley Brooking   . Breast cyst    aspirated followed with imaging  . BREAST MASS 01/28/2010   Qualifier: Diagnosis of  By: Charlett Blake MD, Erline Levine    . Cough, persistent 06/20/2014   rx for bronchospasm pos pist infectious  dec airway irratilbity optimize flonase and   ppi for now  and fu if needed no hx of asthma nl exam excep dry cough sp   . DIPLOPIA, HX OF 04/01/2008   Qualifier: Diagnosis of  By: Regis Bill MD, Standley Brooking   . GERD (gastroesophageal reflux disease) 2006   with esophagitis and gi bleed   . Headache(784.0)    episode diplopia eval neg mri work up  . Tubal pregnancy 1993  . Unspecified vitamin D deficiency 11/13/2007   Qualifier: Diagnosis of  By: Regis Bill MD, Standley Brooking   . UTI (lower urinary tract infection)     Past Surgical History:   Procedure Laterality Date  . BREAST CYST ASPIRATION    . BREAST ENHANCEMENT SURGERY    . COLONOSCOPY    . DILATION AND CURETTAGE OF UTERUS     x 2  . TONSILLECTOMY    . TRIGGER FINGER RELEASE  12/16   right thumb weingold   . TUBAL LIGATION    . UPPER GASTROINTESTINAL ENDOSCOPY      Family History  Problem Relation Age of Onset  . Colon cancer Father 2  . Heart attack Father   . Breast cancer Other   . Colon cancer Other   . Diabetes Other   . Hyperlipidemia Other   . Hypertension Other   . Stroke Other   . Heart disease Other   . Other Other        clots grandmother  . Colon cancer Maternal Grandfather     Social History   Tobacco Use  . Smoking status: Never Smoker  . Smokeless tobacco: Never Used  . Tobacco comment: rare  Vaping Use  . Vaping Use: Never used  Substance Use Topics  . Alcohol use: Yes    Alcohol/week: 3.0 standard drinks    Types: 3 Glasses of wine per week  . Drug use: No      Current Outpatient Medications:  .  Ascorbic Acid (VITAMIN C  PO), Take 1 tablet by mouth daily., Disp: , Rfl:  .  cycloSPORINE (RESTASIS) 0.05 % ophthalmic emulsion, Restasis 0.05 % eye drops in a dropperette, Disp: , Rfl:  .  pantoprazole (PROTONIX) 40 MG tablet, Take 1 tablet (40 mg total) by mouth daily., Disp: 90 tablet, Rfl: 1 .  zolpidem (AMBIEN) 5 MG tablet, TAKE 1 TABLET BY MOUTH AT BEDTIME AS NEEDED FOR INSOMNIA, Disp: 30 tablet, Rfl: 0  EXAM: BP Readings from Last 3 Encounters:  12/20/19 118/68  07/14/18 120/64  07/03/18 122/72    VITALS per patient if applicable:  GENERAL: alert, oriented, appears well and in no acute distress  HEENT: atraumatic, conjunttiva clear, no obvious abnormalities on inspection of external nose and ears  NECK: normal movements of the head and neck  LUNGS: on inspection no signs of respiratory distress, breathing rate appears normal, no obvious gross SOB, gasping or wheezing  CV: no obvious cyanosis  MS: moves all  visible extremities without noticeable abnormality  PSYCH/NEURO: pleasant and cooperative, no obvious depression or anxiety, speech and thought processing grossly intact Lab Results  Component Value Date   WBC 6.4 08/01/2018   HGB 12.4 08/01/2018   HCT 36.6 08/01/2018   PLT 243.0 08/01/2018   GLUCOSE 93 08/01/2018   CHOL 201 (H) 08/01/2018   TRIG 212.0 (H) 08/01/2018   HDL 50.70 08/01/2018   LDLDIRECT 128.0 08/01/2018   LDLCALC 148 (H) 02/05/2016   ALT 16 10/31/2012   AST 15 10/31/2012   NA 141 08/01/2018   K 4.2 08/01/2018   CL 107 08/01/2018   CREATININE 0.82 08/01/2018   BUN 21 08/01/2018   CO2 26 08/01/2018   TSH 1.27 09/19/2014   HGBA1C 5.4 02/05/2016    ASSESSMENT AND PLAN:  Discussed the following assessment and plan:    ICD-10-CM   1. Insomnia, unspecified type  G47.00   2. Medication management  Z79.899   3. Osteoporosis, unspecified osteoporosis type, unspecified pathological fracture presence  M81.0    on prolia per Dr Lynnette Caffey    Basically getting primary care  From  Her gyne  On prolia  And utd on pap and mammo  Had covid vaccine pfizer in  March April  Counseled. Benefit more than risk of medications at this time   to continue. Aware of potential memory  Issues possible with some meds .  Expectant management and discussion of plan and treatment with opportunity to ask questions and all were answered. The patient agreed with the plan and demonstrated an understanding of the instructions.  (she is not diabeticdespite ehr flagging as such  [facility]  [place]  Advised to call back or seek an in-person evaluation if worsening  or having  further concerns . Return in about 1 year (around 12/19/2020) for medication, preventive /cpx and medications or inperson  visit  or as needed .   Shanon Ace, MD   Record and review  And counseled update ehr  30 minutes

## 2019-12-20 NOTE — Telephone Encounter (Signed)
Per Dr. Regis Bill: get Marcia Irwin  update for covid vaccine, records from Marcia Irwin( her gyne)  about pap and mammogram and  dexa.  to be abstracted ?  I dont see scanned  info but she has had these done there and she is now on prolia .   also she is not diabetic and i tried to cancel out the  parameters for that . she does see an eye doc  Thanks  I called and left a detailed voice message for patient to call us back and give [place] the Covid vaccine information, maker, dates given, lot numbers, and where they were given.  Also asked patient to fill out a records release form from her OBGYN so they can send [place] her pap, mammo, and dexa results. Also for her eye doctor so we can scan her results.

## 2020-01-04 DIAGNOSIS — H43392 Other vitreous opacities, left eye: Secondary | ICD-10-CM | POA: Diagnosis not present

## 2020-01-04 DIAGNOSIS — H04122 Dry eye syndrome of left lacrimal gland: Secondary | ICD-10-CM | POA: Diagnosis not present

## 2020-01-04 DIAGNOSIS — H40013 Open angle with borderline findings, low risk, bilateral: Secondary | ICD-10-CM | POA: Diagnosis not present

## 2020-01-04 DIAGNOSIS — H04121 Dry eye syndrome of right lacrimal gland: Secondary | ICD-10-CM | POA: Diagnosis not present

## 2020-01-04 DIAGNOSIS — H04123 Dry eye syndrome of bilateral lacrimal glands: Secondary | ICD-10-CM | POA: Diagnosis not present

## 2020-01-04 DIAGNOSIS — H43813 Vitreous degeneration, bilateral: Secondary | ICD-10-CM | POA: Diagnosis not present

## 2020-01-16 ENCOUNTER — Other Ambulatory Visit: Payer: Self-pay | Admitting: Internal Medicine

## 2020-01-16 NOTE — Telephone Encounter (Signed)
Last OV 12/20/2019 (Virtual Visit)  Last filled 12/13/2019, # 30 with 0 refills

## 2020-02-07 DIAGNOSIS — M810 Age-related osteoporosis without current pathological fracture: Secondary | ICD-10-CM | POA: Diagnosis not present

## 2020-02-12 DIAGNOSIS — M810 Age-related osteoporosis without current pathological fracture: Secondary | ICD-10-CM | POA: Diagnosis not present

## 2020-02-15 ENCOUNTER — Other Ambulatory Visit: Payer: Self-pay | Admitting: Adult Health

## 2020-02-15 NOTE — Telephone Encounter (Signed)
Last OV 12/20/2019  Last filled 01/16/2020, # 30 with 0 refills

## 2020-03-20 ENCOUNTER — Encounter: Payer: Self-pay | Admitting: Family Medicine

## 2020-03-20 ENCOUNTER — Telehealth (INDEPENDENT_AMBULATORY_CARE_PROVIDER_SITE_OTHER): Payer: BC Managed Care – PPO | Admitting: Family Medicine

## 2020-03-20 ENCOUNTER — Other Ambulatory Visit: Payer: Self-pay

## 2020-03-20 VITALS — Temp 99.4°F | Wt 127.0 lb

## 2020-03-20 DIAGNOSIS — J029 Acute pharyngitis, unspecified: Secondary | ICD-10-CM

## 2020-03-20 DIAGNOSIS — R350 Frequency of micturition: Secondary | ICD-10-CM

## 2020-03-20 DIAGNOSIS — R509 Fever, unspecified: Secondary | ICD-10-CM

## 2020-03-20 DIAGNOSIS — Z7189 Other specified counseling: Secondary | ICD-10-CM | POA: Diagnosis not present

## 2020-03-20 LAB — POC URINALSYSI DIPSTICK (AUTOMATED)
Bilirubin, UA: NEGATIVE
Blood, UA: NEGATIVE
Glucose, UA: NEGATIVE
Ketones, UA: NEGATIVE
Leukocytes, UA: NEGATIVE
Nitrite, UA: NEGATIVE
Protein, UA: NEGATIVE
Spec Grav, UA: 1.015 (ref 1.010–1.025)
Urobilinogen, UA: 0.2 E.U./dL
pH, UA: 6 (ref 5.0–8.0)

## 2020-03-20 NOTE — Progress Notes (Signed)
Virtual Visit via Video Note  I connected with Marcia Irwin on 03/20/20 at  2:30 PM EDT by a video enabled telemedicine application 2/2 [id number] pandemic and verified that I am speaking with the correct person using two identifiers.  Location patient: home Location provider:work or home office Persons participating in the virtual visit: patient, provider  I discussed the limitations of evaluation and management by telemedicine and the availability of in person appointments. The patient expressed understanding and agreed to proceed.   HPI: Pt is a 64 yo female with "low grade fever" 99.69F, urinary frequency/pressure, mild dysuria, sore throat.  Pt denies cough, HAs, rhinorrhea, constipation, diarrhea, sick contacts, changes in soaps, lotions, detergents.  Pt states she has not been as conscious with her water intake. 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  Pt had her 2nd COVID vaccine in April.  Pt waiting to get her influenza vaccine as typically feels sick for a few days after it.    Pt has a COVID PCR test pending.    Pt's mother is at [facility], so she has her temp checked regularly before visiting her, hence the reason she noticed the mild elevation.  ROS: See pertinent positives and negatives per HPI.  Past Medical History:  Diagnosis Date  . ANXIETY, SITUATIONAL 04/24/2008   Qualifier: Diagnosis of  By: Regis Bill MD, Standley Brooking   . Breast cyst    aspirated followed with imaging  . BREAST MASS 01/28/2010   Qualifier: Diagnosis of  By: Charlett Blake MD, Erline Levine    . Cough, persistent 06/20/2014   rx for bronchospasm pos pist infectious  dec airway irratilbity optimize flonase and   ppi for now  and fu if needed no hx of asthma nl exam excep dry cough sp   . DIPLOPIA, HX OF 04/01/2008   Qualifier: Diagnosis of  By: Regis Bill MD, Standley Brooking   . GERD (gastroesophageal reflux disease) 2006   with esophagitis and gi bleed   . Headache(784.0)    episode  diplopia eval neg mri work up  . Tubal pregnancy 1993  . Unspecified vitamin D deficiency 11/13/2007   Qualifier: Diagnosis of  By: Regis Bill MD, Standley Brooking   . UTI (lower urinary tract infection)     Past Surgical History:  Procedure Laterality Date  . BREAST CYST ASPIRATION    . BREAST ENHANCEMENT SURGERY    . COLONOSCOPY    . DILATION AND CURETTAGE OF UTERUS     x 2  . TONSILLECTOMY    . TRIGGER FINGER RELEASE  12/16   right thumb weingold   . TUBAL LIGATION    . UPPER GASTROINTESTINAL ENDOSCOPY      Family History  Problem Relation Age of Onset  . Colon cancer Father 84  . Heart attack Father   . Breast cancer Other   . Colon cancer Other   . Diabetes Other   . Hyperlipidemia Other   . Hypertension Other   . Stroke Other   . Heart disease Other   . Other Other        clots grandmother  . Colon cancer Maternal Grandfather      Current Outpatient Medications:  .  Ascorbic Acid (VITAMIN C PO), Take 1 tablet by mouth daily., Disp: , Rfl:  .  cycloSPORINE (RESTASIS) 0.05 % ophthalmic emulsion, Restasis 0.05 % eye drops in a dropperette, Disp: , Rfl:  .  pantoprazole (PROTONIX) 40 MG tablet, Take 1 tablet (40 mg  total) by mouth daily., Disp: 90 tablet, Rfl: 1 .  zolpidem (AMBIEN) 5 MG tablet, TAKE 1 TABLET BY MOUTH AT BEDTIME AS NEEDED FOR INSOMNIA, Disp: 30 tablet, Rfl: 3  EXAM:  VITALS per patient if applicable: RR between 44-81 bpm  GENERAL: alert, oriented, appears well and in no acute distress  HEENT: atraumatic, conjunctiva clear, no obvious abnormalities on inspection of external nose and ears  NECK: normal movements of the head and neck  LUNGS: on inspection no signs of respiratory distress, breathing rate appears normal, no obvious gross SOB, gasping or wheezing  CV: no obvious cyanosis  MS: moves all visible extremities without noticeable abnormality  PSYCH/NEURO: pleasant and cooperative, no obvious depression or anxiety, speech and thought processing  grossly intact  ASSESSMENT AND PLAN:  Discussed the following assessment and plan:  Frequent urination -UA negative -Discussed increasing p.o. 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 The patient was provided an opportunity to ask questions and all were answered. The patient agreed with the plan and demonstrated an understanding of the instructions.   The patient was advised to call back or seek an in-person evaluation if the symptoms worsen or if the condition fails to improve as anticipated.  I provided 10 minutes of non-face-to-face time during this encounter.   Billie Ruddy, MD

## 2020-03-20 NOTE — Addendum Note (Signed)
Addended by: Wyvonne Lenz on: 03/20/2020 03:08 PM   Modules accepted: Orders

## 2020-03-21 LAB — URINE CULTURE
MICRO NUMBER:: 11044492
SPECIMEN QUALITY:: ADEQUATE

## 2020-06-02 DIAGNOSIS — Z1231 Encounter for screening mammogram for malignant neoplasm of breast: Secondary | ICD-10-CM | POA: Diagnosis not present

## 2020-06-02 DIAGNOSIS — Z01419 Encounter for gynecological examination (general) (routine) without abnormal findings: Secondary | ICD-10-CM | POA: Diagnosis not present

## 2020-06-02 DIAGNOSIS — Z6824 Body mass index (BMI) 24.0-24.9, adult: Secondary | ICD-10-CM | POA: Diagnosis not present

## 2020-06-11 ENCOUNTER — Other Ambulatory Visit: Payer: Self-pay | Admitting: Internal Medicine

## 2020-06-19 ENCOUNTER — Telehealth (INDEPENDENT_AMBULATORY_CARE_PROVIDER_SITE_OTHER): Payer: BC Managed Care – PPO | Admitting: Internal Medicine

## 2020-06-19 ENCOUNTER — Encounter: Payer: Self-pay | Admitting: Internal Medicine

## 2020-06-19 DIAGNOSIS — G4700 Insomnia, unspecified: Secondary | ICD-10-CM | POA: Diagnosis not present

## 2020-06-19 DIAGNOSIS — M810 Age-related osteoporosis without current pathological fracture: Secondary | ICD-10-CM

## 2020-06-19 DIAGNOSIS — Z79899 Other long term (current) drug therapy: Secondary | ICD-10-CM | POA: Diagnosis not present

## 2020-06-19 NOTE — Progress Notes (Signed)
Virtual Visit via Video Note  I connected with@ on 06/19/20 at  8:30 AM EST by a video enabled telemedicine application and verified that I am speaking with the correct person using two identifiers. Location patient: home Location provider:home office Persons participating in the virtual visit: patient, provider  Technical problem   With my chart  Poss iphone  System compatibility.  So changed to telephone visit   [place] national recommendations  regarding COVID 19 pandemic   video visit is advised over in office visit for this patient.  Patient aware  of the limitations of evaluation and management by telemedicine and  availability of in person appointments. and agreed to proceed.   HPI: Marcia Irwin presents for video  Change to tele visit for med check  Last visit w me 7 21   Has been taking ambien on a regular basis   Usually for work and travel  ( job)  50 hours per week  Medically  sta ble  Sees gyne utd pap mammo and on prolia  Dr Mitchel Honour TAD neg rare etoh take vit d .  Mom has alzhiemers  In [employer] and   Had car accident and  Injury   Working on getting her  Into [employer] assistance .    Taking ambien to help with sleep and still works without sig se .  Doing ok in day   Otherwise exercising   Healthy  Had  Second covid vaccine in October had se .  Getting flu vaccine soon. ROS: See pertinent positives and negatives per HPI.  Past Medical History:  Diagnosis Date  . ANXIETY, SITUATIONAL 04/24/2008   Qualifier: Diagnosis of  By: Fabian Sharp MD, Neta Mends   . Breast cyst    aspirated followed with imaging  . BREAST MASS 01/28/2010   Qualifier: Diagnosis of  By: Abner Greenspan MD, Misty Stanley    . Cough, persistent 06/20/2014   rx for bronchospasm pos pist infectious  dec airway irratilbity optimize flonase and   ppi for now  and fu if needed no hx of asthma nl exam excep dry cough sp   . DIPLOPIA, HX OF 04/01/2008   Qualifier: Diagnosis of  By: Fabian Sharp MD, Neta Mends   . GERD  (gastroesophageal reflux disease) 2006   with esophagitis and gi bleed   . Headache(784.0)    episode diplopia eval neg mri work up  . Tubal pregnancy 1993  . Unspecified vitamin D deficiency 11/13/2007   Qualifier: Diagnosis of  By: Fabian Sharp MD, Neta Mends   . UTI (lower urinary tract infection)     Past Surgical History:  Procedure Laterality Date  . BREAST CYST ASPIRATION    . BREAST ENHANCEMENT SURGERY    . COLONOSCOPY    . DILATION AND CURETTAGE OF UTERUS     x 2  . TONSILLECTOMY    . TRIGGER FINGER RELEASE  12/16   right thumb weingold   . TUBAL LIGATION    . UPPER GASTROINTESTINAL ENDOSCOPY      Family History  Problem Relation Age of Onset  . Colon cancer Father 52  . Heart attack Father   . Breast cancer Other   . Colon cancer Other   . Diabetes Other   . Hyperlipidemia Other   . Hypertension Other   . Stroke Other   . Heart disease Other   . Other Other        clots grandmother  . Colon cancer Maternal Grandfather  Social History   Tobacco Use  . Smoking status: Never Smoker  . Smokeless tobacco: Never Used  . Tobacco comment: rare  Vaping Use  . Vaping Use: Never used  Substance Use Topics  . Alcohol use: Yes    Alcohol/week: 3.0 standard drinks    Types: 3 Glasses of wine per week  . Drug use: No      Current Outpatient Medications:  .  Ascorbic Acid (VITAMIN C PO), Take 1 tablet by mouth daily., Disp: , Rfl:  .  cycloSPORINE (RESTASIS) 0.05 % ophthalmic emulsion, Restasis 0.05 % eye drops in a dropperette, Disp: , Rfl:  .  pantoprazole (PROTONIX) 40 MG tablet, Take 1 tablet (40 mg total) by mouth daily., Disp: 90 tablet, Rfl: 1 .  PROLIA 60 MG/ML SOSY injection, Inject into the skin., Disp: , Rfl:  .  zolpidem (AMBIEN) 5 MG tablet, TAKE 1 TABLET BY MOUTH EVERY DAY AT BEDTIME AS NEEDED FOR INSOMNIA, Disp: 30 tablet, Rfl: 3  EXAM: BP Readings from Last 3 Encounters:  12/20/19 118/68  07/14/18 120/64  07/03/18 122/72    VITALS per patient  if applicable:  GENERAL: alert, oriented, appears well and in no acute distress  PSYCH/NEURO: pleasant and cooperative, no obvious depression or anxiety, speech and thought processing grossly intact Lab Results  Component Value Date   WBC 6.4 08/01/2018   HGB 12.4 08/01/2018   HCT 36.6 08/01/2018   PLT 243.0 08/01/2018   GLUCOSE 93 08/01/2018   CHOL 201 (H) 08/01/2018   TRIG 212.0 (H) 08/01/2018   HDL 50.70 08/01/2018   LDLDIRECT 128.0 08/01/2018   LDLCALC 148 (H) 02/05/2016   ALT 16 10/31/2012   AST 15 10/31/2012   NA 141 08/01/2018   K 4.2 08/01/2018   CL 107 08/01/2018   CREATININE 0.82 08/01/2018   BUN 21 08/01/2018   CO2 26 08/01/2018   TSH 1.27 09/19/2014   HGBA1C 5.4 02/05/2016    ASSESSMENT AND PLAN:  Discussed the following assessment and plan:    ICD-10-CM   1. Medication management  Z79.899   2. Insomnia, unspecified type  G47.00   3. Osteoporosis, unspecified osteoporosis type, unspecified pathological fracture presence  M81.0   Benefit more than risk of medications  to continue. For now aware . Situational insomnia also now with moms  Medical condition.  utd   On hcm x to get flu vaccine. Counseled.   Expectant management and discussion of plan and treatment with opportunity to ask questions and all were answered. Advised to call back or seek an in-person evaluation  having  further concerns . Ok to do 90 day at next refill ( request)  Plan FU 6+ months or as indicated  Return for 6+ monthsn med check . I provided  12 minutes of non-face-to-face time during this encounter.   Shanon Ace, MD

## 2020-06-26 DIAGNOSIS — M65312 Trigger thumb, left thumb: Secondary | ICD-10-CM | POA: Diagnosis not present

## 2020-06-26 DIAGNOSIS — M1812 Unilateral primary osteoarthritis of first carpometacarpal joint, left hand: Secondary | ICD-10-CM | POA: Diagnosis not present

## 2020-07-16 DIAGNOSIS — M810 Age-related osteoporosis without current pathological fracture: Secondary | ICD-10-CM | POA: Diagnosis not present

## 2020-10-09 DIAGNOSIS — M810 Age-related osteoporosis without current pathological fracture: Secondary | ICD-10-CM | POA: Diagnosis not present

## 2020-10-10 ENCOUNTER — Other Ambulatory Visit: Payer: Self-pay | Admitting: Internal Medicine

## 2020-10-10 NOTE — Telephone Encounter (Signed)
Last OV: 06/19/2020 Last refill: 06/17/2020 Future OV: None scheduled

## 2021-01-12 DIAGNOSIS — L570 Actinic keratosis: Secondary | ICD-10-CM | POA: Diagnosis not present

## 2021-01-13 ENCOUNTER — Telehealth (INDEPENDENT_AMBULATORY_CARE_PROVIDER_SITE_OTHER): Payer: BC Managed Care – PPO | Admitting: Internal Medicine

## 2021-01-13 ENCOUNTER — Encounter: Payer: Self-pay | Admitting: Internal Medicine

## 2021-01-13 VITALS — Temp 98.8°F | Ht 61.0 in | Wt 127.0 lb

## 2021-01-13 DIAGNOSIS — J019 Acute sinusitis, unspecified: Secondary | ICD-10-CM | POA: Diagnosis not present

## 2021-01-13 MED ORDER — AMOXICILLIN-POT CLAVULANATE 875-125 MG PO TABS: 1.0000 | ORAL_TABLET | Freq: Two times a day (BID) | ORAL | 0 refills | Status: DC

## 2021-01-13 NOTE — Progress Notes (Signed)
Virtual Visit via Video Note  I connected withNAME@ on 01/13/21 at  3:30 PM EDT by a video enabled telemedicine application and verified that I am speaking with the correct person using two identifiers. Location patient: home Location provider:work office Persons participating in the virtual visit: patient, provider  [place] national recommendations  regarding COVID 19 pandemic   video visit is advised over in office visit for this patient.  Patient aware  of the limitations of evaluation and management by telemedicine and  availability of in person appointments. and agreed to proceed.   HPI: Marcia Irwin presents for video visit because of new sinus symptoms.  For the last 3 or 4 days she has noticed sinus pressure initially [place] then postnasal drainage cough clear to beginning faint yellow when she coughs her face feels pressure.  No fever or systemic symptoms feels like one of her typical sinus infections and does not want to let it get too bad she will be traveling by plane next week.  Has not done a home COVID test last COVID infection was at the very beginning of the pandemic. Using Ambien most times trying to cut back not every night. ROS: See pertinent positives and negatives per HPI.  Past Medical History:  Diagnosis Date   ANXIETY, SITUATIONAL 04/24/2008   Qualifier: Diagnosis of  By: Regis Bill MD, Standley Brooking    Breast cyst    aspirated followed with imaging   BREAST MASS 01/28/2010   Qualifier: Diagnosis of  By: Charlett Blake MD, Stacey     Cough, persistent 06/20/2014   rx for bronchospasm pos pist infectious  dec airway irratilbity optimize flonase and   ppi for now  and fu if needed no hx of asthma nl exam excep dry cough sp    DIPLOPIA, HX OF 04/01/2008   Qualifier: Diagnosis of  By: Regis Bill MD, Standley Brooking    GERD (gastroesophageal reflux disease) 2006   with esophagitis and gi bleed    Headache(784.0)    episode diplopia eval neg mri work up   Tubal pregnancy 1993   Unspecified  vitamin D deficiency 11/13/2007   Qualifier: Diagnosis of  By: Regis Bill MD, Standley Brooking    UTI (lower urinary tract infection)     Past Surgical History:  Procedure Laterality Date   BREAST CYST ASPIRATION     BREAST ENHANCEMENT SURGERY     COLONOSCOPY     DILATION AND CURETTAGE OF UTERUS     x 2   TONSILLECTOMY     TRIGGER FINGER RELEASE  12/16   right thumb weingold    TUBAL LIGATION     UPPER GASTROINTESTINAL ENDOSCOPY      Family History  Problem Relation Age of Onset   Colon cancer Father 55   Heart attack Father    Breast cancer Other    Colon cancer Other    Diabetes Other    Hyperlipidemia Other    Hypertension Other    Stroke Other    Heart disease Other    Other Other        clots grandmother   Colon cancer Maternal Grandfather     Social History   Tobacco Use   Smoking status: Never   Smokeless tobacco: Never   Tobacco comments:    rare  Vaping Use   Vaping Use: Never used  Substance Use Topics   Alcohol use: Yes    Alcohol/week: 3.0 standard drinks    Types: 3 Glasses of wine per week  Drug use: No      Current Outpatient Medications:    amoxicillin-clavulanate (AUGMENTIN) 875-125 MG tablet, Take 1 tablet by mouth every 12 (twelve) hours. For sinusitis, Disp: 14 tablet, Rfl: 0   Ascorbic Acid (VITAMIN C PO), Take 1 tablet by mouth daily., Disp: , Rfl:    cycloSPORINE (RESTASIS) 0.05 % ophthalmic emulsion, Restasis 0.05 % eye drops in a dropperette, Disp: , Rfl:    pantoprazole (PROTONIX) 40 MG tablet, Take 1 tablet (40 mg total) by mouth daily., Disp: 90 tablet, Rfl: 1   PROLIA 60 MG/ML SOSY injection, Inject into the skin., Disp: , Rfl:    zolpidem (AMBIEN) 5 MG tablet, TAKE 1 TABLET BY MOUTH AT BEDTIME AS NEEDED FOR INSOMNIA, Disp: 30 tablet, Rfl: 3  EXAM: BP Readings from Last 3 Encounters:  12/20/19 118/68  07/14/18 120/64  07/03/18 122/72    VITALS per patient if applicable:  GENERAL: alert, oriented, appears well and in no acute  distress nontoxic mildly congested no cough during visit  HEENT: atraumatic, conjunttiva clear, no obvious abnormalities on inspection of external nose and ears  NECK: normal movements of the head and neck  LUNGS: on inspection no signs of respiratory distress, breathing rate appears normal, no obvious gross SOB, gasping or wheezing  CV: no obvious cyanosis  PSYCH/NEURO: pleasant and cooperative, no obvious depression or anxiety, speech and thought processing grossly intact   ASSESSMENT AND PLAN:  Discussed the following assessment and plan:    ICD-10-CM   1. Acute sinusitis, recurrence not specified, unspecified location  J01.90    hx of same  may be viral or allergic travels air cont INCS saline can try antihistamine and if progresive add antibiotic  , advise  home covid testing also.     At this point would use sinus hygiene do not yet take the antibiotic unless persistent progressive.  Counseled.   Expectant management and discussion of plan and treatment with opportunity to ask questions and all were answered. The patient agreed with the plan and demonstrated an understanding of the instructions.   Advised to call back or seek an in-person evaluation if worsening  or having  further concerns . Return if symptoms worsen or fail to improve as expected.   Shanon Ace, MD

## 2021-01-29 DIAGNOSIS — M65312 Trigger thumb, left thumb: Secondary | ICD-10-CM | POA: Diagnosis not present

## 2021-02-07 ENCOUNTER — Other Ambulatory Visit: Payer: Self-pay | Admitting: Internal Medicine

## 2021-03-17 DIAGNOSIS — M65312 Trigger thumb, left thumb: Secondary | ICD-10-CM | POA: Diagnosis not present

## 2021-04-03 DIAGNOSIS — M65312 Trigger thumb, left thumb: Secondary | ICD-10-CM | POA: Diagnosis not present

## 2021-04-13 DIAGNOSIS — M810 Age-related osteoporosis without current pathological fracture: Secondary | ICD-10-CM | POA: Diagnosis not present

## 2021-04-21 DIAGNOSIS — L578 Other skin changes due to chronic exposure to nonionizing radiation: Secondary | ICD-10-CM | POA: Diagnosis not present

## 2021-04-21 DIAGNOSIS — D225 Melanocytic nevi of trunk: Secondary | ICD-10-CM | POA: Diagnosis not present

## 2021-04-21 DIAGNOSIS — L821 Other seborrheic keratosis: Secondary | ICD-10-CM | POA: Diagnosis not present

## 2021-04-21 DIAGNOSIS — C44311 Basal cell carcinoma of skin of nose: Secondary | ICD-10-CM | POA: Diagnosis not present

## 2021-06-04 DIAGNOSIS — Z01419 Encounter for gynecological examination (general) (routine) without abnormal findings: Secondary | ICD-10-CM | POA: Diagnosis not present

## 2021-06-04 DIAGNOSIS — Z13820 Encounter for screening for osteoporosis: Secondary | ICD-10-CM | POA: Diagnosis not present

## 2021-06-04 DIAGNOSIS — Z6825 Body mass index (BMI) 25.0-25.9, adult: Secondary | ICD-10-CM | POA: Diagnosis not present

## 2021-06-04 DIAGNOSIS — Z1231 Encounter for screening mammogram for malignant neoplasm of breast: Secondary | ICD-10-CM | POA: Diagnosis not present

## 2021-06-09 ENCOUNTER — Other Ambulatory Visit: Payer: Self-pay | Admitting: Internal Medicine

## 2021-06-11 ENCOUNTER — Telehealth (INDEPENDENT_AMBULATORY_CARE_PROVIDER_SITE_OTHER): Payer: BC Managed Care – PPO | Admitting: Internal Medicine

## 2021-06-11 ENCOUNTER — Encounter: Payer: Self-pay | Admitting: Internal Medicine

## 2021-06-11 VITALS — BP 118/70 | Temp 98.2°F

## 2021-06-11 DIAGNOSIS — G479 Sleep disorder, unspecified: Secondary | ICD-10-CM | POA: Diagnosis not present

## 2021-06-11 DIAGNOSIS — Z79899 Other long term (current) drug therapy: Secondary | ICD-10-CM | POA: Diagnosis not present

## 2021-06-11 DIAGNOSIS — G4700 Insomnia, unspecified: Secondary | ICD-10-CM

## 2021-06-11 DIAGNOSIS — C44311 Basal cell carcinoma of skin of nose: Secondary | ICD-10-CM | POA: Diagnosis not present

## 2021-06-11 MED ORDER — ZOLPIDEM TARTRATE 5 MG PO TABS: ORAL_TABLET | ORAL | 0 refills | Status: DC

## 2021-06-11 NOTE — Progress Notes (Signed)
Virtual Visit via Video Note  I connected with Marcia Irwin  on 06/11/21 at  9:30 AM EST by a video enabled telemedicine application and verified that I am speaking with the correct person using two identifiers. Location patient: home Location provider:whome office Persons participating in the virtual visit: patient, provider  [place] national recommendations  regarding COVID 19 pandemic   video visit is advised over in office visit for this patient.  Patient aware  of the limitations of evaluation and management by telemedicine and  availability of in person appointments. and agreed to proceed.   HPI: Marcia Irwin presents for video visit for med check   Has insomnia sleep disturbance and related to travel and job.  Continuing with ambien most nights sometimes takes less than whole dose.  Plans retire in April.   No change in health . Is on prolia  and utd per gyne mammogram . Colon surveillance  5 year due 12 2023 .  Declines flu vaccine . Never had documented covid infection.  ROS: See pertinent positives and negatives per HPI.  Past Medical History:  Diagnosis Date   ANXIETY, SITUATIONAL 04/24/2008   Qualifier: Diagnosis of  By: Regis Bill MD, Standley Brooking    Breast cyst    aspirated followed with imaging   BREAST MASS 01/28/2010   Qualifier: Diagnosis of  By: Charlett Blake MD, Stacey     Cough, persistent 06/20/2014   rx for bronchospasm pos pist infectious  dec airway irratilbity optimize flonase and   ppi for now  and fu if needed no hx of asthma nl exam excep dry cough sp    DIPLOPIA, HX OF 04/01/2008   Qualifier: Diagnosis of  By: Regis Bill MD, Standley Brooking    GERD (gastroesophageal reflux disease) 2006   with esophagitis and gi bleed    Headache(784.0)    episode diplopia eval neg mri work up   Tubal pregnancy 1993   Unspecified vitamin D deficiency 11/13/2007   Qualifier: Diagnosis of  By: Regis Bill MD, Standley Brooking    UTI (lower urinary tract infection)     Past Surgical History:   Procedure Laterality Date   BREAST CYST ASPIRATION     BREAST ENHANCEMENT SURGERY     COLONOSCOPY     DILATION AND CURETTAGE OF UTERUS     x 2   TONSILLECTOMY     TRIGGER FINGER RELEASE  12/16   right thumb weingold    TUBAL LIGATION     UPPER GASTROINTESTINAL ENDOSCOPY      Family History  Problem Relation Age of Onset   Colon cancer Father 16   Heart attack Father    Breast cancer Other    Colon cancer Other    Diabetes Other    Hyperlipidemia Other    Hypertension Other    Stroke Other    Heart disease Other    Other Other        clots grandmother   Colon cancer Maternal Grandfather     Social History   Tobacco Use   Smoking status: Never   Smokeless tobacco: Never   Tobacco comments:    rare  Vaping Use   Vaping Use: Never used  Substance Use Topics   Alcohol use: Yes    Alcohol/week: 3.0 standard drinks    Types: 3 Glasses of wine per week   Drug use: No      Current Outpatient Medications:    amoxicillin-clavulanate (AUGMENTIN) 875-125 MG tablet, Take 1 tablet by mouth every 12 (  twelve) hours. For sinusitis, Disp: 14 tablet, Rfl: 0   Ascorbic Acid (VITAMIN C PO), Take 1 tablet by mouth daily., Disp: , Rfl:    cycloSPORINE (RESTASIS) 0.05 % ophthalmic emulsion, Restasis 0.05 % eye drops in a dropperette, Disp: , Rfl:    pantoprazole (PROTONIX) 40 MG tablet, Take 1 tablet (40 mg total) by mouth daily., Disp: 90 tablet, Rfl: 1   PROLIA 60 MG/ML SOSY injection, Inject into the skin., Disp: , Rfl:    zolpidem (AMBIEN) 5 MG tablet, TAKE 1 TABLET BY MOUTH EVERY DAY AT BEDTIME AS NEEDED FOR INSOMNIA, Disp: 90 tablet, Rfl: 0  EXAM: BP Readings from Last 3 Encounters:  06/11/21 118/70  12/20/19 118/68  07/14/18 120/64    VITALS per patient if applicable:  GENERAL: alert, oriented, appears well and in no acute distress  HEENT: atraumatic, conjunttiva clear, no obvious abnormalities on inspection of external nose and ears  NECK: normal movements of the  head and neck  LUNGS: on inspection no signs of respiratory distress, breathing rate appears normal, no obvious gross SOB, gasping or wheezing  CV: no obvious cyanosis  MS: moves all visible extremities without noticeable abnormality  PSYCH/NEURO: pleasant and cooperative, no obvious depression or anxiety, speech and thought processing grossly intact Lab Results  Component Value Date   WBC 6.4 08/01/2018   HGB 12.4 08/01/2018   HCT 36.6 08/01/2018   PLT 243.0 08/01/2018   GLUCOSE 93 08/01/2018   CHOL 201 (H) 08/01/2018   TRIG 212.0 (H) 08/01/2018   HDL 50.70 08/01/2018   LDLDIRECT 128.0 08/01/2018   LDLCALC 148 (H) 02/05/2016   ALT 16 10/31/2012   AST 15 10/31/2012   NA 141 08/01/2018   K 4.2 08/01/2018   CL 107 08/01/2018   CREATININE 0.82 08/01/2018   BUN 21 08/01/2018   CO2 26 08/01/2018   TSH 1.27 09/19/2014   HGBA1C 5.4 02/05/2016    ASSESSMENT AND PLAN:  Discussed the following assessment and plan:    ICD-10-CM   1. Medication management  Z79.899     2. Disturbance in sleep behavior  G47.9    related to frequent travel     3. Insomnia, unspecified type  G47.00      Continue med  with precautions known . Benefit more than risk of medications  to continue. Plan in person check screens   when convenient after retiring  Counseled.   Expectant management and discussion of plan and treatment with opportunity to ask questions and all were answered. The patient agreed with the plan and demonstrated an understanding of the instructions.   Advised to call back or seek an in-person evaluation if worsening  or having  further concerns  in interim. Return for in   6-10 months in person cpx or visit  med check .    Shanon Ace, MD

## 2021-07-24 DIAGNOSIS — M810 Age-related osteoporosis without current pathological fracture: Secondary | ICD-10-CM | POA: Diagnosis not present

## 2021-07-30 DIAGNOSIS — M810 Age-related osteoporosis without current pathological fracture: Secondary | ICD-10-CM | POA: Diagnosis not present

## 2021-09-07 ENCOUNTER — Other Ambulatory Visit: Payer: Self-pay | Admitting: Internal Medicine

## 2021-09-09 NOTE — Telephone Encounter (Signed)
Last Vv 06/11/21 ?Filled 06/11/21 ?Is it ok to refill? ?

## 2021-09-10 DIAGNOSIS — C44311 Basal cell carcinoma of skin of nose: Secondary | ICD-10-CM | POA: Diagnosis not present

## 2021-10-08 ENCOUNTER — Encounter: Payer: Self-pay | Admitting: Internal Medicine

## 2021-10-23 ENCOUNTER — Encounter (INDEPENDENT_AMBULATORY_CARE_PROVIDER_SITE_OTHER): Payer: Self-pay | Admitting: Ophthalmology

## 2021-10-23 ENCOUNTER — Ambulatory Visit (INDEPENDENT_AMBULATORY_CARE_PROVIDER_SITE_OTHER): Payer: Medicare Other | Admitting: Ophthalmology

## 2021-10-23 DIAGNOSIS — Q141 Congenital malformation of retina: Secondary | ICD-10-CM

## 2021-10-23 DIAGNOSIS — H43813 Vitreous degeneration, bilateral: Secondary | ICD-10-CM

## 2021-10-23 DIAGNOSIS — H33301 Unspecified retinal break, right eye: Secondary | ICD-10-CM | POA: Diagnosis not present

## 2021-10-23 DIAGNOSIS — H25813 Combined forms of age-related cataract, bilateral: Secondary | ICD-10-CM

## 2021-10-23 DIAGNOSIS — H469 Unspecified optic neuritis: Secondary | ICD-10-CM

## 2021-10-23 NOTE — Progress Notes (Signed)
?[facility] Note ? ?10/23/2021 ? ?  ? ?CHIEF COMPLAINT ?Patient presents for Retina Evaluation ? ? ?HISTORY OF PRESENT ILLNESS: ?Marcia Irwin is a 66 y.o. female who presents to the clinic today for:  ? ?HPI   ? ? Retina Evaluation   ?In left eye.  This started 8 days ago.  Duration of 8 days.  Associated Symptoms Flashes, Floaters and Distortion.  Negative for Blind Spot, Pain, Redness, Photophobia, Glare, Trauma, Scalp Tenderness, Jaw Claudication, Shoulder/Hip pain, Fever, Weight Loss and Fatigue.  I, the attending physician,  performed the HPI with the patient and updated documentation appropriately. ? ?  ?  ? ? Comments   ?Pt here for new ret eval from Dr. Lucianne Lei for ret hemorrhage OS.  Pt states last Thursday (8 days ago) she started experiencing blurry vision and floaters OU, FOL in OD. Pt wears rx specs, current pair she just picked up yesterday. Does not report any history of BP of diabetes. 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 She also reports hx of [facility] sx in OD w/ Dr. Herbert Deaner to improve distance vision.  ? ?  ?  ?Last edited by Bernarda Caffey, MD on 10/23/2021  4:59 PM.  ?  ?Patient seen Dr. Lucianne Lei today.  Cloudy vision x1 week OU.  Over the weekend noticed FOL OD only.  Increase in floaters Sunday or Monday.  ?Had loss of vision OS 25 years ago.  Optic neuritis OS.  Started seeing Dr. Herbert Deaner.  Had IV steroids and a port put in.  Denies MS or any neurological problems since optic neuritis Dx.   ?Fam hx glaucoma (grandmother) ? ?Referring physician: ?Lisabeth Pick, MD ?[facility] [facility] ?[place],  [place] [address] ? ?HISTORICAL INFORMATION:  ? ?Selected notes from the [place] [facility] ?Referred by Dr. Herbert Deaner for concern of elevated retina with heme ?LEE: 05.12.23 ?Ocular Hx- PVD OD ?  ? ?CURRENT MEDICATIONS: ?Current Outpatient Medications (Ophthalmic Drugs)  ?Medication Sig  ? cycloSPORINE (RESTASIS) 0.05 %  ophthalmic emulsion Restasis 0.05 % eye drops in a dropperette  ? ?No current facility-administered medications for this visit. (Ophthalmic Drugs)  ? ?Current Outpatient Medications (Other)  ?Medication Sig  ? Ascorbic Acid (VITAMIN C PO) Take 1 tablet by mouth daily.  ? PROLIA 60 MG/ML SOSY injection Inject into the skin.  ? zolpidem (AMBIEN) 5 MG tablet TAKE 1 TABLET BY MOUTH AT BEDTIME AS NEEDED FOR INSOMNIA  ? amoxicillin-clavulanate (AUGMENTIN) 875-125 MG tablet Take 1 tablet by mouth every 12 (twelve) hours. For sinusitis (Patient not taking: Reported on 10/23/2021)  ? pantoprazole (PROTONIX) 40 MG tablet Take 1 tablet (40 mg total) by mouth daily. (Patient not taking: Reported on 10/23/2021)  ? ?No current facility-administered medications for this visit. (Other)  ? ?REVIEW OF SYSTEMS: ?ROS   ?Positive for: Musculoskeletal, Eyes ?Negative for: Constitutional, Gastrointestinal, Neurological, Skin, Genitourinary, HENT, Endocrine, Cardiovascular, Respiratory, Psychiatric, Allergic/Imm, Heme/Lymph ?Last edited by Leonie Douglas, [place] on 10/23/2021  2:08 PM.  ?  ? ?ALLERGIES ?No Known Allergies ? ?PAST MEDICAL HISTORY ?Past Medical History:  ?Diagnosis Date  ? ANXIETY, SITUATIONAL 04/24/2008  ? Qualifier: Diagnosis of  By: Regis Bill MD, Standley Brooking   ? Breast cyst   ? aspirated followed with imaging  ? BREAST MASS 01/28/2010  ? Qualifier: Diagnosis of  By: Charlett Blake MD, Erline Levine    ? Cough, persistent 06/20/2014  ? rx for  bronchospasm pos pist infectious  dec airway irratilbity optimize flonase and   ppi for now  and fu if needed no hx of asthma nl exam excep dry cough sp   ? DIPLOPIA, HX OF 04/01/2008  ? Qualifier: Diagnosis of  By: Regis Bill MD, Standley Brooking   ? GERD (gastroesophageal reflux disease) 2006  ? with esophagitis and gi bleed   ? Headache(784.0)   ? episode diplopia eval neg mri work up  ? Tubal pregnancy 1993  ? Unspecified vitamin D deficiency 11/13/2007  ? Qualifier: Diagnosis of  By: Regis Bill MD, Standley Brooking   ? UTI (lower urinary  tract infection)   ? ?Past Surgical History:  ?Procedure Laterality Date  ? BREAST CYST ASPIRATION    ? BREAST ENHANCEMENT SURGERY    ? COLONOSCOPY    ? DILATION AND CURETTAGE OF UTERUS    ? x 2  ? PHOTOREFRACTIVE KERATOTOMY Right 2008  ? Dr. Herbert Deaner  ? TONSILLECTOMY    ? TRIGGER FINGER RELEASE  05/15/2015  ? right thumb weingold   ? TUBAL LIGATION    ? UPPER GASTROINTESTINAL ENDOSCOPY    ? ?FAMILY HISTORY ?Family History  ?Problem Relation Age of Onset  ? Cataracts Mother   ? Colon cancer Father 53  ? Heart attack Father   ? Glaucoma Maternal Grandmother   ? Colon cancer Maternal Grandfather   ? Breast cancer Other   ? Colon cancer Other   ? Diabetes Other   ? Hyperlipidemia Other   ? Hypertension Other   ? Stroke Other   ? Heart disease Other   ? Other Other   ?     clots grandmother  ? ?SOCIAL HISTORY ?Social History  ? ?Tobacco Use  ? Smoking status: Never  ? Smokeless tobacco: Never  ? Tobacco comments:  ?  rare  ?Vaping Use  ? Vaping Use: Never used  ?Substance Use Topics  ? Alcohol use: Yes  ?  Alcohol/week: 3.0 standard drinks  ?  Types: 3 Glasses of wine per week  ? Drug use: No  ?  ? ?  ?OPHTHALMIC EXAM: ? ?Base Eye Exam   ? ? Visual Acuity (Snellen - Linear)   ? ?   Right Left  ? Dist cc 20/20 -2 20/20 -2  ? ? Correction: Glasses  ? ?  ?  ? ? Tonometry (Tonopen, 1:29 PM)   ? ?   Right Left  ? Pressure 13 13  ? ?  ?  ? ? Pupils   ? ?   Dark  ? Right Dilated  ? Left Dilated  ? ?  ?  ? ? Visual Fields (Counting fingers)   ? ?   Left Right  ?  Full Full  ? ?  ?  ? ? Extraocular Movement   ? ?   Right Left  ?  Full, Ortho Full, Ortho  ? ?  ?  ? ? Neuro/Psych   ? ? Oriented x3: Yes  ? Mood/Affect: Normal  ? ?  ?  ? ? Dilation   ? ? Both eyes: 1.0% Mydriacyl, 2.5% Phenylephrine @ 1:31 PM  ? ?  ?  ? ?  ? ?Slit Lamp and Fundus Exam   ? ? External Exam   ? ?   Right Left  ? External Normal Normal  ? ?  ?  ? ? Slit Lamp Exam   ? ?   Right Left  ? Lids/Lashes Dermatochalasis - upper lid Dermatochalasis - upper  lid  ?  Conjunctiva/Sclera White and quiet White and quiet  ? Cornea Clear Trace Punctate epithelial erosions  ? Anterior Chamber Deep and quiet Deep and quiet  ? Iris Round and dilated Round and dilated  ? Lens 2+ Nuclear sclerosis, 2+ Cortical cataract 2+ Nuclear sclerosis, 2+ Cortical cataract  ? Anterior Vitreous Vitreous syneresis, Posterior vitreous detachment, Vitreous condensations Vitreous syneresis, Posterior vitreous detachment, Vitreous condensations  ? ?  ?  ? ? Fundus Exam   ? ?   Right Left  ? Disc Pink and sharp, +cupping Pink and sharp, +cupping  ? C/D Ratio 0.75 0.8  ? Macula Flat, Good foveal reflex, RPE mottling, trace ERM superior mac, no heme, no edema Flat, Good foveal reflex, RPE mottling, trace ERM, no heme, no edema  ? Vessels Attenuated, Tortuous Attenuated, Tortuous  ? Periphery Punctate heme 0845, Punctate VR tufts with break 0900 and 0130 Attached  ? ?  ?  ? ?  ? ?Refraction   ? ? Wearing Rx   ? ?   Sphere Cylinder Axis Add  ? Right +0.75 +0.25 136 +2.00  ? Left -1.00 +0.75 162 +2.00  ? ?  ?  ? ? Manifest Refraction   ? ?   Sphere Cylinder Axis Dist VA Add  ? Right +0.75 +0.25 136 20/20 +2.00  ? Left -1.00 +0.75 162 20/20 +2.00  ? ?  ?  ? ?  ? ?IMAGING AND PROCEDURES  ?Imaging and Procedures for 10/23/2021 ? ?OCT, Retina - OU - Both Eyes   ? ?   ?Right Eye ?Quality was good. Central Foveal Thickness: 259. Progression has no prior data. Findings include normal foveal contour, no IRF, no SRF (Trace vitreous opacities, mild ERM superior mac).  ? ?Left Eye ?Quality was good. Central Foveal Thickness: 252. Progression has no prior data. Findings include normal foveal contour, no IRF, no SRF.  ? ?Notes ?*Images captured and stored on drive ? ?Diagnosis / Impression:  ?NFP; no IRF/SRF  ? ?Clinical management:  ?See below ? ?Abbreviations: NFP - Normal foveal profile. CME - cystoid macular edema. PED - pigment epithelial detachment. IRF - intraretinal fluid. SRF - subretinal fluid. EZ - ellipsoid zone.  ERM - epiretinal membrane. ORA - outer retinal atrophy. ORT - outer retinal tubulation. SRHM - subretinal hyper-reflective material. IRHM - intraretinal hyper-reflective material  ? ?  ? ?Repair Retinal

## 2021-11-03 NOTE — Progress Notes (Signed)
[facility] Note  11/06/2021     CHIEF COMPLAINT Patient presents for Retina Follow Up   HISTORY OF PRESENT ILLNESS: Marcia Irwin is a 66 y.o. female who presents to the clinic today for:   HPI     Retina Follow Up   Patient presents with  Other.  In right eye.  This started 2 weeks ago.  I, the attending physician,  performed the HPI with the patient and updated documentation appropriately.        Comments   Patient here for 2 weeks retina follow up for vitreoretinal tuft OD. Patient states vision pretty good. Gets blurry every so often. Not doing what it was before when came in. On occasion has a flash of light. No eye pain.       Last edited by Bernarda Caffey, MD on 11/09/2021 12:29 AM.    Pt states no problems after her laser procedure at last visit, she was able to use Lotemax for about 5 days, she has had occasional floaters since then and saw one fol yesterday   Referring physician: Burnis Medin, MD [address],  [place] [id number]  HISTORICAL INFORMATION:   Selected notes from the MEDICAL RECORD NUMBER Referred by Dr. Herbert Deaner for concern of elevated retina with heme LEE: 05.12.23 Ocular Hx- PVD OD    CURRENT MEDICATIONS: Current Outpatient Medications (Ophthalmic Drugs)  Medication Sig   cycloSPORINE (RESTASIS) 0.05 % ophthalmic emulsion Restasis 0.05 % eye drops in a dropperette   No current facility-administered medications for this visit. (Ophthalmic Drugs)   Current Outpatient Medications (Other)  Medication Sig   Ascorbic Acid (VITAMIN C PO) Take 1 tablet by mouth daily.   PROLIA 60 MG/ML SOSY injection Inject into the skin.   zolpidem (AMBIEN) 5 MG tablet TAKE 1 TABLET BY MOUTH AT BEDTIME AS NEEDED FOR INSOMNIA   amoxicillin-clavulanate (AUGMENTIN) 875-125 MG tablet Take 1 tablet by mouth every 12 (twelve) hours. For sinusitis (Patient not taking: Reported on 10/23/2021)   pantoprazole (PROTONIX) 40 MG  tablet Take 1 tablet (40 mg total) by mouth daily. (Patient not taking: Reported on 10/23/2021)   No current facility-administered medications for this visit. (Other)   REVIEW OF SYSTEMS: ROS   Positive for: Musculoskeletal, Eyes Negative for: Constitutional, Gastrointestinal, Neurological, Skin, Genitourinary, HENT, Endocrine, Cardiovascular, Respiratory, Psychiatric, Allergic/Imm, Heme/Lymph Last edited by Theodore Demark, COA on 11/06/2021  8:09 AM.      ALLERGIES No Known Allergies  PAST MEDICAL HISTORY Past Medical History:  Diagnosis Date   ANXIETY, SITUATIONAL 04/24/2008   Qualifier: Diagnosis of  By: Regis Bill MD, Standley Brooking    Breast cyst    aspirated followed with imaging   BREAST MASS 01/28/2010   Qualifier: Diagnosis of  By: Charlett Blake MD, Stacey     Cough, persistent 06/20/2014   rx for bronchospasm pos pist infectious  dec airway irratilbity optimize flonase and   ppi for now  and fu if needed no hx of asthma nl exam excep dry cough sp    DIPLOPIA, HX OF 04/01/2008   Qualifier: Diagnosis of  By: Regis Bill MD, Standley Brooking    GERD (gastroesophageal reflux disease) 2006   with esophagitis and gi bleed    Headache(784.0)    episode diplopia eval neg mri work up   Tubal pregnancy 1993   Unspecified vitamin D deficiency 11/13/2007   Qualifier: Diagnosis of  By: Regis Bill MD, Standley Brooking    UTI (lower urinary  tract infection)    Past Surgical History:  Procedure Laterality Date   BREAST CYST ASPIRATION     BREAST ENHANCEMENT SURGERY     COLONOSCOPY     DILATION AND CURETTAGE OF UTERUS     x 2   PHOTOREFRACTIVE KERATOTOMY Right 2008   Dr. Herbert Deaner   TONSILLECTOMY     TRIGGER FINGER RELEASE  05/15/2015   right thumb weingold    TUBAL LIGATION     UPPER GASTROINTESTINAL ENDOSCOPY     FAMILY HISTORY Family History  Problem Relation Age of Onset   Cataracts Mother    Colon cancer Father 39   Heart attack Father    Glaucoma Maternal Grandmother    Colon cancer Maternal Grandfather     Breast cancer Other    Colon cancer Other    Diabetes Other    Hyperlipidemia Other    Hypertension Other    Stroke Other    Heart disease Other    Other Other        clots grandmother   SOCIAL HISTORY Social History   Tobacco Use   Smoking status: Never   Smokeless tobacco: Never   Tobacco comments:    rare  Vaping Use   Vaping Use: Never used  Substance Use Topics   Alcohol use: Yes    Alcohol/week: 3.0 standard drinks    Types: 3 Glasses of wine per week   Drug use: No       OPHTHALMIC EXAM:  Base Eye Exam     Visual Acuity (Snellen - Linear)       Right Left   Dist cc 20/20 20/20    Correction: Glasses         Tonometry (Tonopen, 8:06 AM)       Right Left   Pressure 12 13         Pupils       Dark Light Shape React APD   Right 3 2 Round Brisk None   Left 3 2 Round Brisk None         Visual Fields (Counting fingers)       Left Right    Full Full         Extraocular Movement       Right Left    Full, Ortho Full, Ortho         Neuro/Psych     Oriented x3: Yes   Mood/Affect: Normal         Dilation     Both eyes: 1.0% Mydriacyl, 2.5% Phenylephrine @ 8:06 AM           Slit Lamp and Fundus Exam     External Exam       Right Left   External Normal Normal         Slit Lamp Exam       Right Left   Lids/Lashes Dermatochalasis - upper lid Dermatochalasis - upper lid   Conjunctiva/Sclera White and quiet White and quiet   Cornea  Trace tear film debris   Anterior Chamber Deep and quiet Deep and quiet   Iris Round and dilated Round and dilated   Lens 2+ Nuclear sclerosis, 2+ Cortical cataract 2+ Nuclear sclerosis, 2+ Cortical cataract   Anterior Vitreous Vitreous syneresis, Posterior vitreous detachment, Vitreous condensations Vitreous syneresis, Posterior vitreous detachment, Vitreous condensations         Fundus Exam       Right Left   Disc Pink and sharp, +cupping Pink and  sharp, +cupping   C/D Ratio 0.75  0.8   Macula Flat, Good foveal reflex, RPE mottling, trace ERM superior mac, no heme or edema Flat, Good foveal reflex, RPE mottling, trace ERM, no heme, no edema   Vessels Attenuated, Tortuous Attenuated, Tortuous   Periphery Punctate heme 0845 - improving, Punctate VR tufts with breaks at 0900 and 0130 -- good laser surrounding Attached           Refraction     Wearing Rx       Sphere Cylinder Axis Add   Right +0.75 +0.25 136 +2.00   Left -1.00 +0.75 162 +2.00           IMAGING AND PROCEDURES  Imaging and Procedures for 11/06/2021  OCT, Retina - OU - Both Eyes       Right Eye Quality was good. Central Foveal Thickness: 265. Progression has been stable. Findings include normal foveal contour, no IRF, no SRF (Trace vitreous opacities, mild ERM superior mac).   Left Eye Quality was good. Central Foveal Thickness: 246. Progression has been stable. Findings include normal foveal contour, no IRF, no SRF.   Notes *Images captured and stored on drive  Diagnosis / Impression:  NFP; no IRF/SRF   Clinical management:  See below  Abbreviations: NFP - Normal foveal profile. CME - cystoid macular edema. PED - pigment epithelial detachment. IRF - intraretinal fluid. SRF - subretinal fluid. EZ - ellipsoid zone. ERM - epiretinal membrane. ORA - outer retinal atrophy. ORT - outer retinal tubulation. SRHM - subretinal hyper-reflective material. IRHM - intraretinal hyper-reflective material            ASSESSMENT/PLAN:    ICD-10-CM   1. Vitreoretinal tuft of right eye  Q14.1     2. Posterior vitreous detachment of both eyes  H43.813 OCT, Retina - OU - Both Eyes    3. Right retinal defect  H33.301     4. Optic neuritis, left  H46.9     5. 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 PVD OU  - acute PVD OD with persistent flashes and floaters, onset around 5.5.23  - OS w/ stable floaters  - Discussed findings and prognosis  - VR tufts w/  retinal breaks OD -- see below; OS without RT/RD  - No RD on 360 scleral depressed exam OD   - Reviewed s/s of RT/RD  2,3. Vitreoretinal tufts w/ retinal breaks OD   - tufts with breaks at 0900 and 0130 - Discussed findings and prognosis - Potential treatment options including laser retinopexy discussed with patient.  - s/p laser retinopexy OD (05.12.23) -- good laser surrounding - completed Lotemax QID OD x5-7 days - f/u in 3 months   4.  H/o Optic Neuritis OS   - onset ~25 years ago per pt  - Dr. Herbert Deaner diagnosed and treated   - denies history of MS  5.   Mixed Cataract OU - The symptoms of cataract, surgical options, and treatments and risks were discussed with patient. - discussed diagnosis and progression - under the expert care of Dr. Lucianne Lei - monitor   Ophthalmic Meds Ordered this visit:  No orders of the defined types were placed in this encounter.    Return in about 3 months (around 02/06/2022) for f/u VR tufts OD, DFE, OCT.  There are no Patient Instructions on file for this visit.   Explained the diagnoses, plan, and follow up with the patient  and they expressed understanding.  Patient expressed understanding of the importance of proper follow up care.   This document serves as a record of services personally performed by Gardiner Sleeper, MD, PhD. It was created on their behalf by Leonie Douglas, an ophthalmic technician. The creation of this record is the provider's dictation and/or activities during the visit.    Electronically signed by: Leonie Douglas COA, 11/09/21  12:30 AM  This document serves as a record of services personally performed by Gardiner Sleeper, MD, PhD. It was created on their behalf by San Jetty. Owens Shark, OA an ophthalmic technician. The creation of this record is the provider's dictation and/or activities during the visit.    Electronically signed by: San Jetty. Owens Shark, [place] 05.26.2023 12:30 AM  Gardiner Sleeper, M.D., Ph.D. Diseases & Surgery of the Retina  and Vitreous [facility]  I have reviewed the above documentation for accuracy and completeness, and I agree with the above. Gardiner Sleeper, M.D., Ph.D. 11/09/21 12:35 AM   Abbreviations: M myopia (nearsighted); A astigmatism; H hyperopia (farsighted); P presbyopia; Mrx spectacle prescription;  CTL contact lenses; OD right eye; OS left eye; OU both eyes  XT exotropia; ET esotropia; PEK punctate epithelial keratitis; PEE punctate epithelial erosions; DES dry eye syndrome; MGD meibomian gland dysfunction; ATs artificial tears; PFAT's preservative free artificial tears; [place] nuclear sclerotic cataract; PSC posterior subcapsular cataract; ERM epi-retinal membrane; PVD posterior vitreous detachment; RD retinal detachment; DM diabetes mellitus; DR diabetic retinopathy; NPDR non-proliferative diabetic retinopathy; PDR proliferative diabetic retinopathy; CSME clinically significant macular edema; DME diabetic macular edema; dbh dot blot hemorrhages; CWS cotton wool spot; POAG primary open angle glaucoma; C/D cup-to-disc ratio; HVF humphrey visual field; GVF goldmann visual field; OCT optical coherence tomography; IOP intraocular pressure; BRVO Branch retinal vein occlusion; CRVO central retinal vein occlusion; CRAO central retinal artery occlusion; BRAO branch retinal artery occlusion; RT retinal tear; SB scleral buckle; PPV pars plana vitrectomy; VH Vitreous hemorrhage; PRP panretinal laser photocoagulation; IVK intravitreal kenalog; VMT vitreomacular traction; MH Macular hole;  NVD neovascularization of the disc; NVE neovascularization elsewhere; AREDS age related eye disease study; ARMD age related macular degeneration; POAG primary open angle glaucoma; EBMD epithelial/anterior basement membrane dystrophy; ACIOL anterior chamber intraocular lens; IOL intraocular lens; PCIOL posterior chamber intraocular lens; Phaco/IOL phacoemulsification with intraocular lens placement; Page photorefractive  keratectomy; LASIK laser assisted in situ keratomileusis; HTN hypertension; DM diabetes mellitus; COPD chronic obstructive pulmonary disease

## 2021-11-06 ENCOUNTER — Encounter (INDEPENDENT_AMBULATORY_CARE_PROVIDER_SITE_OTHER): Payer: Self-pay | Admitting: Ophthalmology

## 2021-11-06 ENCOUNTER — Ambulatory Visit (INDEPENDENT_AMBULATORY_CARE_PROVIDER_SITE_OTHER): Payer: Medicare Other | Admitting: Ophthalmology

## 2021-11-06 DIAGNOSIS — Q141 Congenital malformation of retina: Secondary | ICD-10-CM | POA: Diagnosis not present

## 2021-11-06 DIAGNOSIS — H43813 Vitreous degeneration, bilateral: Secondary | ICD-10-CM

## 2021-11-06 DIAGNOSIS — H469 Unspecified optic neuritis: Secondary | ICD-10-CM | POA: Diagnosis not present

## 2021-11-06 DIAGNOSIS — H33301 Unspecified retinal break, right eye: Secondary | ICD-10-CM

## 2021-11-06 DIAGNOSIS — H25813 Combined forms of age-related cataract, bilateral: Secondary | ICD-10-CM

## 2021-11-09 ENCOUNTER — Encounter (INDEPENDENT_AMBULATORY_CARE_PROVIDER_SITE_OTHER): Payer: Self-pay | Admitting: Ophthalmology

## 2021-12-03 ENCOUNTER — Other Ambulatory Visit: Payer: Self-pay | Admitting: Internal Medicine

## 2021-12-10 ENCOUNTER — Other Ambulatory Visit: Payer: Self-pay | Admitting: Internal Medicine

## 2021-12-13 NOTE — Progress Notes (Signed)
Chief Complaint  Patient presents with   Annual Exam    Fasting     HPI: Patient  Kabrea Seeney  66 y.o. comes in today for Preventive Health Care visit   Dr Lynnette Caffey  mammo and dexa.  Health Maintenance  Topic Date Due   Hepatitis C Screening  Never done   COVID-19 Vaccine (1) 06/16/2022 (Originally 11/26/1956)   INFLUENZA VACCINE  01/12/2022   COLONOSCOPY (Pts 45-77yr Insurance coverage will need to be confirmed)  05/17/2022   OPHTHALMOLOGY EXAM  10/03/2022   MAMMOGRAM  05/15/2023   TETANUS/TDAP  02/04/2026   Pneumonia Vaccine 66 Years old  Completed   DEXA SCAN  Completed   Zoster Vaccines- Shingrix  Completed   HPV VACCINES  Aged Out   Health Maintenance Review LIFESTYLE:  Exercise:   10 miles per week Plus  Tobacco/ETS: no Alcohol:  social  Sugar beverages: no Sleep:still some issues    not a good sleeper.  Drug use: no HH of  5   down from 7   no pets   Work: retired April 4      ROS:  REST of 12 system review negative except as per HPI   Past Medical History:  Diagnosis Date   ANXIETY, SITUATIONAL 04/24/2008   Qualifier: Diagnosis of  By: PRegis BillMD, WStandley Brooking   Breast cyst    aspirated followed with imaging   BREAST MASS 01/28/2010   Qualifier: Diagnosis of  By: BCharlett BlakeMD, Stacey     Cough, persistent 06/20/2014   rx for bronchospasm pos pist infectious  dec airway irratilbity optimize flonase and   ppi for now  and fu if needed no hx of asthma nl exam excep dry cough sp    DIPLOPIA, HX OF 04/01/2008   Qualifier: Diagnosis of  By: PRegis BillMD, WStandley Brooking   GERD (gastroesophageal reflux disease) 2006   with esophagitis and gi bleed    Headache(784.0)    episode diplopia eval neg mri work up   Tubal pregnancy 1993   Unspecified vitamin D deficiency 11/13/2007   Qualifier: Diagnosis of  By: PRegis BillMD, WStandley Brooking   UTI (lower urinary tract infection)     Past Surgical History:  Procedure Laterality Date   BREAST CYST ASPIRATION     BREAST ENHANCEMENT  SURGERY     COLONOSCOPY     DILATION AND CURETTAGE OF UTERUS     x 2   PHOTOREFRACTIVE KERATOTOMY Right 2008   Dr. HHerbert Deaner  TONSILLECTOMY     TRIGGER FINGER RELEASE  05/15/2015   right thumb weingold    TUBAL LIGATION     UPPER GASTROINTESTINAL ENDOSCOPY      Family History  Problem Relation Age of Onset   Cataracts Mother    Colon cancer Father 534  Heart attack Father    Glaucoma Maternal Grandmother    Colon cancer Maternal Grandfather    Breast cancer Other    Colon cancer Other    Diabetes Other    Hyperlipidemia Other    Hypertension Other    Stroke Other    Heart disease Other    Other Other        clots grandmother    Social History   Socioeconomic History   Marital status: Married    Spouse name: Not on file   Number of children: Not on file   Years of education: Not on file   Highest education level: Not on file  Occupational History   Not on file  Tobacco Use   Smoking status: Never   Smokeless tobacco: Never   Tobacco comments:    rare  Vaping Use   Vaping Use: Never used  Substance and Sexual Activity   Alcohol use: Yes    Alcohol/week: 3.0 standard drinks of alcohol    Types: 3 Glasses of wine per week   Drug use: No   Sexual activity: Not on file  Other Topics Concern   Not on file  Social History Narrative   Married   Regular exercise- no    HH of 2 except college  Age.   Pets cat and dog   Sleep   60 hours week  Travels.    Neg ets .   2-3 etoh per week.   Social Determinants of Health   Financial Resource Strain: Not on file  Food Insecurity: Not on file  Transportation Needs: Not on file  Physical Activity: Not on file  Stress: Not on file  Social Connections: Not on file    Outpatient Medications Prior to Visit  Medication Sig Dispense Refill   Ascorbic Acid (VITAMIN C PO) Take 1 tablet by mouth daily.     cycloSPORINE (RESTASIS) 0.05 % ophthalmic emulsion Restasis 0.05 % eye drops in a dropperette     PROLIA 60 MG/ML  SOSY injection Inject into the skin.     zolpidem (AMBIEN) 5 MG tablet TAKE 1 TABLET BY MOUTH EVERY DAY AT BEDTIME AS NEEDED FOR INSOMNIA 90 tablet 0   amoxicillin-clavulanate (AUGMENTIN) 875-125 MG tablet Take 1 tablet by mouth every 12 (twelve) hours. For sinusitis 14 tablet 0   pantoprazole (PROTONIX) 40 MG tablet Take 1 tablet (40 mg total) by mouth daily. 90 tablet 1   No facility-administered medications prior to visit.     EXAM:  BP 108/78 (BP Location: Left Arm, Patient Position: Sitting, Cuff Size: Normal)   Pulse 89   Temp 99.3 F (37.4 C) (Oral)   Ht '5\' 1"'$  (1.549 m)   Wt 133 lb 9.6 oz (60.6 kg)   SpO2 96%   BMI 25.24 kg/m   Body mass index is 25.24 kg/m. Wt Readings from Last 3 Encounters:  12/14/21 133 lb 9.6 oz (60.6 kg)  01/13/21 127 lb (57.6 kg)  03/20/20 127 lb (57.6 kg)    Physical Exam: Vital signs reviewed JHE:RDEY is a well-developed well-nourished alert cooperative    who appearsr stated age in no acute distress.  HEENT: normocephalic atraumatic , Eyes: PERRL EOM's full, conjunctiva clear, Nares: paten,t no deformity discharge or tenderness., Ears: no deformity EAC's clear TMs with normal landmarks. NECK: supple without masses, thyromegaly or bruits. CHEST/PULM:  Clear to auscultation and percussion breath sounds equal no wheeze , rales or rhonchi. No chest wall deformities or tenderness. Breast: normal by inspection . No dimpling, discharge, masses, tenderness or discharge . CV: PMI is nondisplaced, S1 S2 no gallops, murmurs, rubs. Peripheral pulses are full without delay.No JVD .  ABDOMEN: Bowel sounds normal nontender  No guard or rebound, no hepato splenomegal no CVA tenderness.   Extremtities:  No clubbing cyanosis or edema, no acute joint swelling or redness no focal atrophy NEURO:  Oriented x3, cranial nerves 3-12 appear to be intact, no obvious focal weakness,gait within normal limits no abnormal reflexes or asymmetrical SKIN: No acute rashes normal  turgor, color, no bruising or petechiae. PSYCH: Oriented, good eye contact, no obvious depression anxiety, cognition and judgment appear normal. LN: no cervical  axillary iadenopathy  Lab Results  Component Value Date   WBC 6.8 12/14/2021   HGB 13.5 12/14/2021   HCT 40.2 12/14/2021   PLT 253.0 12/14/2021   GLUCOSE 92 12/14/2021   CHOL 253 (H) 12/14/2021   TRIG 100.0 12/14/2021   HDL 57.80 12/14/2021   LDLDIRECT 128.0 08/01/2018   LDLCALC 175 (H) 12/14/2021   ALT 16 10/31/2012   AST 15 10/31/2012   NA 139 12/14/2021   K 4.2 12/14/2021   CL 106 12/14/2021   CREATININE 0.86 12/14/2021   BUN 19 12/14/2021   CO2 25 12/14/2021   TSH 1.15 12/14/2021   HGBA1C 5.4 02/05/2016    BP Readings from Last 3 Encounters:  12/14/21 108/78  06/11/21 118/70  12/20/19 118/68    Labplan  reviewed with patient   ASSESSMENT AND PLAN:  Discussed the following assessment and plan:    ICD-10-CM   1. Medication management  Z79.899 Lipid panel    Basic metabolic panel    CBC with Differential/Platelet    TSH    TSH    CBC with Differential/Platelet    Basic metabolic panel    Lipid panel    Pneumococcal conjugate vaccine 20-valent (Prevnar 20)    2. Insomnia, unspecified type  G47.00 Lipid panel    Basic metabolic panel    CBC with Differential/Platelet    TSH    TSH    CBC with Differential/Platelet    Basic metabolic panel    Lipid panel    3. Osteoporosis, unspecified osteoporosis type, unspecified pathological fracture presence  M81.0 Lipid panel    Basic metabolic panel    CBC with Differential/Platelet    TSH    TSH    CBC with Differential/Platelet    Basic metabolic panel    Lipid panel    4. Elevated LDL cholesterol level  E78.00 Lipid panel    Basic metabolic panel    CBC with Differential/Platelet    TSH    TSH    CBC with Differential/Platelet    Basic metabolic panel    Lipid panel    5. Encounter for immunization  Z23 Pneumococcal conjugate vaccine  20-valent (Prevnar 20)    6. Need for pneumococcal 20-valent conjugate vaccination  Z23     Continue tyr dec use and Continue lifestyle intervention healthy eating and exercise .   Return in about 6 months (around 06/16/2022) for med check if still taking ambien regualry  otherwise yearly.  Patient Care Team: Burnis Medin, MD as PCP - General Carlean Purl Ofilia Neas, MD as Consulting Physician (Gastroenterology) Linda Hedges, DO as Consulting Physician (Obstetrics and Gynecology) Patient Instructions  Good to see you .  Continue lifestyle intervention healthy eating and exercise .  Try weaning as possible ambien.  Prevnar 20 today.     Standley Brooking. Kezia Benevides M.D.

## 2021-12-14 ENCOUNTER — Ambulatory Visit (INDEPENDENT_AMBULATORY_CARE_PROVIDER_SITE_OTHER): Payer: Medicare Other | Admitting: Internal Medicine

## 2021-12-14 ENCOUNTER — Encounter: Payer: Self-pay | Admitting: Internal Medicine

## 2021-12-14 VITALS — BP 108/78 | HR 89 | Temp 99.3°F | Ht 61.0 in | Wt 133.6 lb

## 2021-12-14 DIAGNOSIS — G4700 Insomnia, unspecified: Secondary | ICD-10-CM | POA: Diagnosis not present

## 2021-12-14 DIAGNOSIS — M810 Age-related osteoporosis without current pathological fracture: Secondary | ICD-10-CM | POA: Diagnosis not present

## 2021-12-14 DIAGNOSIS — Z23 Encounter for immunization: Secondary | ICD-10-CM

## 2021-12-14 DIAGNOSIS — E7800 Pure hypercholesterolemia, unspecified: Secondary | ICD-10-CM | POA: Diagnosis not present

## 2021-12-14 DIAGNOSIS — Z0000 Encounter for general adult medical examination without abnormal findings: Secondary | ICD-10-CM

## 2021-12-14 DIAGNOSIS — Z79899 Other long term (current) drug therapy: Secondary | ICD-10-CM | POA: Diagnosis not present

## 2021-12-14 LAB — CBC WITH DIFFERENTIAL/PLATELET
Basophils Absolute: 0 10*3/uL (ref 0.0–0.1)
Basophils Relative: 0.7 % (ref 0.0–3.0)
Eosinophils Absolute: 0.1 10*3/uL (ref 0.0–0.7)
Eosinophils Relative: 1.3 % (ref 0.0–5.0)
HCT: 40.2 % (ref 36.0–46.0)
Hemoglobin: 13.5 g/dL (ref 12.0–15.0)
Lymphocytes Relative: 48.6 % — ABNORMAL HIGH (ref 12.0–46.0)
Lymphs Abs: 3.3 10*3/uL (ref 0.7–4.0)
MCHC: 33.4 g/dL (ref 30.0–36.0)
MCV: 87.1 fl (ref 78.0–100.0)
Monocytes Absolute: 0.6 10*3/uL (ref 0.1–1.0)
Monocytes Relative: 8.1 % (ref 3.0–12.0)
Neutro Abs: 2.8 10*3/uL (ref 1.4–7.7)
Neutrophils Relative %: 41.3 % — ABNORMAL LOW (ref 43.0–77.0)
Platelets: 253 10*3/uL (ref 150.0–400.0)
RBC: 4.62 Mil/uL (ref 3.87–5.11)
RDW: 13.5 % (ref 11.5–15.5)
WBC: 6.8 10*3/uL (ref 4.0–10.5)

## 2021-12-14 LAB — BASIC METABOLIC PANEL
BUN: 19 mg/dL (ref 6–23)
CO2: 25 mEq/L (ref 19–32)
Calcium: 9.4 mg/dL (ref 8.4–10.5)
Chloride: 106 mEq/L (ref 96–112)
Creatinine, Ser: 0.86 mg/dL (ref 0.40–1.20)
GFR: 70.33 mL/min (ref >60.00)
Glucose, Bld: 92 mg/dL (ref 70–99)
Potassium: 4.2 mEq/L (ref 3.5–5.1)
Sodium: 139 mEq/L (ref 135–145)

## 2021-12-14 LAB — LIPID PANEL
Cholesterol: 253 mg/dL — ABNORMAL HIGH (ref 0–200)
HDL: 57.8 mg/dL (ref >39.00)
LDL Cholesterol: 175 mg/dL — ABNORMAL HIGH (ref 0–99)
NonHDL: 195.15
Total CHOL/HDL Ratio: 4
Triglycerides: 100 mg/dL (ref 0.0–149.0)
VLDL: 20 mg/dL (ref 0.0–40.0)

## 2021-12-14 LAB — TSH: TSH: 1.15 u[IU]/mL (ref 0.35–5.50)

## 2021-12-14 NOTE — Patient Instructions (Addendum)
Good to see you .  Continue lifestyle intervention healthy eating and exercise .  Try weaning as possible ambien.  Prevnar 20 today.

## 2021-12-20 NOTE — Progress Notes (Signed)
Cholesterol is up Intensify lifestyle interventions.   To  improve. You could consider coronary artery calcium score testing ( mini ct)   99 $ self pay . and if calcium build up  you could  begin a statin medication for  prevention of heart attack. Let us know if you want [place] to order this scan  The 10-year ASCVD risk score (Arnett DK, et al., 2019) is: 9.3%   Values used to calculate the score:     Age: 66 years     Sex: Female     Is Non-Hispanic African American: No     Diabetic: Yes     Tobacco smoker: No     Systolic Blood Pressure: 643 mmHg     Is BP treated: No     HDL Cholesterol: 57.8 mg/dL     Total Cholesterol: 253 mg/dL

## 2021-12-21 ENCOUNTER — Other Ambulatory Visit: Payer: Self-pay

## 2021-12-21 DIAGNOSIS — E785 Hyperlipidemia, unspecified: Secondary | ICD-10-CM

## 2021-12-21 DIAGNOSIS — Z9189 Other specified personal risk factors, not elsewhere classified: Secondary | ICD-10-CM

## 2021-12-25 ENCOUNTER — Telehealth: Payer: Self-pay

## 2021-12-25 NOTE — Telephone Encounter (Signed)
Medical clearance request for [facility] in folder Last Ov 12/14/21- Med management  Please advise

## 2021-12-29 ENCOUNTER — Encounter: Payer: Self-pay | Admitting: Internal Medicine

## 2021-12-29 ENCOUNTER — Telehealth: Payer: Self-pay | Admitting: Internal Medicine

## 2021-12-29 NOTE — Telephone Encounter (Signed)
Patient informed of forms faxed to [facility]

## 2021-12-29 NOTE — Telephone Encounter (Signed)
Pt called to follow up on medical clearance form that was faxed last week. Pt would like to know if MD has signed off on the medical clearance for her surgery on Thursday 12/31/21.  Please return her call [phone]

## 2021-12-30 NOTE — Progress Notes (Signed)
Please order self pay scan as above  for hld and cv risk assessment

## 2021-12-30 NOTE — Telephone Encounter (Signed)
Letter has been done 

## 2021-12-30 NOTE — Telephone Encounter (Signed)
Noted  

## 2021-12-30 NOTE — Telephone Encounter (Signed)
I spoke with lulu and was given e-mail to scan form to [email or web address] . I have faxed multiply time without success. Pt and lulu is aware the fax will no go through

## 2021-12-30 NOTE — Telephone Encounter (Signed)
Pt called to confirm that her medical clearance was received and she is very grateful to all who assisted.

## 2021-12-31 NOTE — Telephone Encounter (Signed)
See telephone encounter message

## 2022-01-18 ENCOUNTER — Ambulatory Visit (HOSPITAL_COMMUNITY): Payer: BC Managed Care – PPO

## 2022-01-20 ENCOUNTER — Ambulatory Visit (HOSPITAL_COMMUNITY)
Admission: RE | Admit: 2022-01-20 | Discharge: 2022-01-20 | Disposition: A | Discharge status: Home / Self Care | Payer: Medicare Other | Source: Ambulatory Visit | Attending: Internal Medicine | Admitting: Internal Medicine

## 2022-01-20 DIAGNOSIS — E785 Hyperlipidemia, unspecified: Secondary | ICD-10-CM | POA: Insufficient documentation

## 2022-01-20 DIAGNOSIS — Z9189 Other specified personal risk factors, not elsewhere classified: Secondary | ICD-10-CM | POA: Insufficient documentation

## 2022-01-21 NOTE — Progress Notes (Signed)
Normal non cardiac read

## 2022-01-21 NOTE — Progress Notes (Signed)
Good news; coronary  calcium score  is zero  low  risk .  Continue lifestyle intervention healthy eating and exercise .  Consider repeat in 3-5 years if appropriate .  Waiting on over read

## 2022-02-04 NOTE — Progress Notes (Addendum)
[facility] Note  02/08/2022     CHIEF COMPLAINT Patient presents for Retina Follow Up   HISTORY OF PRESENT ILLNESS: Marcia Irwin is a 66 y.o. female who presents to the clinic today for:   HPI     Retina Follow Up   Patient presents with  Other.  In right eye.  This started months ago.  Duration of 3 months.  Since onset it is stable.  I, the attending physician,  performed the HPI with the patient and updated documentation appropriately.        Comments   Patient feels that the vision is still foggy at times. She is also having pain around the eye in the right eye.       Last edited by Bernarda Caffey, MD on 02/08/2022  8:53 AM.    Patient feels that the vision is still hazy.   Referring physician: Lisabeth Pick, MD [address] [place],  [place] [address]  HISTORICAL INFORMATION:   Selected notes from the MEDICAL RECORD NUMBER Referred by Dr. Lucianne Lei for concern of elevated retina with heme LEE: 05.12.23 Ocular Hx- PVD OD    CURRENT MEDICATIONS: Current Outpatient Medications (Ophthalmic Drugs)  Medication Sig   cycloSPORINE (RESTASIS) 0.05 % ophthalmic emulsion Restasis 0.05 % eye drops in a dropperette   No current facility-administered medications for this visit. (Ophthalmic Drugs)   Current Outpatient Medications (Other)  Medication Sig   Ascorbic Acid (VITAMIN C PO) Take 1 tablet by mouth daily.   PROLIA 60 MG/ML SOSY injection Inject into the skin.   zolpidem (AMBIEN) 5 MG tablet TAKE 1 TABLET BY MOUTH EVERY DAY AT BEDTIME AS NEEDED FOR INSOMNIA   No current facility-administered medications for this visit. (Other)   REVIEW OF SYSTEMS: ROS   Positive for: Musculoskeletal, Eyes Negative for: Constitutional, Gastrointestinal, Neurological, Skin, Genitourinary, HENT, Endocrine, Cardiovascular, Respiratory, Psychiatric, Allergic/Imm, Heme/Lymph Last edited by Annie Paras, COT on 02/08/2022  7:52 AM.      ALLERGIES No Known Allergies  PAST MEDICAL HISTORY Past Medical History:  Diagnosis Date   ANXIETY, SITUATIONAL 04/24/2008   Qualifier: Diagnosis of  By: Regis Bill MD, Standley Brooking    Breast cyst    aspirated followed with imaging   BREAST MASS 01/28/2010   Qualifier: Diagnosis of  By: Charlett Blake MD, Stacey     Cough, persistent 06/20/2014   rx for bronchospasm pos pist infectious  dec airway irratilbity optimize flonase and   ppi for now  and fu if needed no hx of asthma nl exam excep dry cough sp    DIPLOPIA, HX OF 04/01/2008   Qualifier: Diagnosis of  By: Regis Bill MD, Standley Brooking    GERD (gastroesophageal reflux disease) 2006   with esophagitis and gi bleed    Headache(784.0)    episode diplopia eval neg mri work up   Tubal pregnancy 1993   Unspecified vitamin D deficiency 11/13/2007   Qualifier: Diagnosis of  By: Regis Bill MD, Standley Brooking    UTI (lower urinary tract infection)    Past Surgical History:  Procedure Laterality Date   BREAST CYST ASPIRATION     BREAST ENHANCEMENT SURGERY     COLONOSCOPY     DILATION AND CURETTAGE OF UTERUS     x 2   PHOTOREFRACTIVE KERATOTOMY Right 2008   Dr. Herbert Deaner   TONSILLECTOMY     TRIGGER FINGER RELEASE  05/15/2015   right thumb weingold    TUBAL LIGATION  UPPER GASTROINTESTINAL ENDOSCOPY     FAMILY HISTORY Family History  Problem Relation Age of Onset   Cataracts Mother    Colon cancer Father 68   Heart attack Father    Glaucoma Maternal Grandmother    Colon cancer Maternal Grandfather    Breast cancer Other    Colon cancer Other    Diabetes Other    Hyperlipidemia Other    Hypertension Other    Stroke Other    Heart disease Other    Other Other        clots grandmother   SOCIAL HISTORY Social History   Tobacco Use   Smoking status: Never   Smokeless tobacco: Never   Tobacco comments:    rare  Vaping Use   Vaping Use: Never used  Substance Use Topics   Alcohol use: Yes    Alcohol/week: 3.0 standard drinks of alcohol    Types: 3  Glasses of wine per week   Drug use: No       OPHTHALMIC EXAM:  Base Eye Exam     Visual Acuity (Snellen - Linear)       Right Left   Dist cc 20/20 20/20    Correction: Glasses         Tonometry (Tonopen, 7:56 AM)       Right Left   Pressure 13 15         Pupils       Dark Light Shape React APD   Right 3 2 Round Brisk None   Left 3 2 Round Brisk None         Visual Fields       Left Right    Full Full         Extraocular Movement       Right Left    Full, Ortho Full, Ortho         Neuro/Psych     Oriented x3: Yes   Mood/Affect: Normal         Dilation     Both eyes: 1.0% Mydriacyl, 2.5% Phenylephrine @ 7:53 AM           Slit Lamp and Fundus Exam     External Exam       Right Left   External Normal Normal         Slit Lamp Exam       Right Left   Lids/Lashes Dermatochalasis - upper lid Dermatochalasis - upper lid   Conjunctiva/Sclera White and quiet White and quiet   Cornea Trace tear film debris Trace tear film debris   Anterior Chamber Deep and quiet Deep and quiet   Iris Round and dilated Round and dilated   Lens 2+ Nuclear sclerosis, 2+ Cortical cataract 2+ Nuclear sclerosis, 2+ Cortical cataract   Anterior Vitreous Vitreous syneresis, Posterior vitreous detachment, Vitreous condensations Vitreous syneresis, Posterior vitreous detachment, Vitreous condensations         Fundus Exam       Right Left   Disc Pink and sharp, +cupping, mild temporal PPA Pink and sharp, +cupping   C/D Ratio 0.75 0.8   Macula Flat, Good foveal reflex, RPE mottling, trace ERM superior mac, no heme or edema Flat, Good foveal reflex, RPE mottling, trace ERM, no heme, no edema   Vessels Attenuated, Tortuous Attenuated, Tortuous   Periphery Attached; Punctate VR tufts with breaks at 0900 and 0130 -- good laser surrounding, no new RT/RD Attached; no heme; no RT/RD  Refraction     Wearing Rx       Sphere Cylinder Axis Add    Right +0.75 +0.25 136 +2.00   Left -1.00 +0.75 162 +2.00           IMAGING AND PROCEDURES  Imaging and Procedures for 02/08/2022  OCT, Retina - OU - Both Eyes       Right Eye Quality was good. Central Foveal Thickness: 266. Progression has been stable. Findings include normal foveal contour, no IRF, no SRF (Mild ERM superior mac).   Left Eye Quality was good. Central Foveal Thickness: 255. Progression has been stable. Findings include normal foveal contour, no IRF, no SRF.   Notes *Images captured and stored on drive  Diagnosis / Impression:  NFP; no IRF/SRF   Clinical management:  See below  Abbreviations: NFP - Normal foveal profile. CME - cystoid macular edema. PED - pigment epithelial detachment. IRF - intraretinal fluid. SRF - subretinal fluid. EZ - ellipsoid zone. ERM - epiretinal membrane. ORA - outer retinal atrophy. ORT - outer retinal tubulation. SRHM - subretinal hyper-reflective material. IRHM - intraretinal hyper-reflective material             ASSESSMENT/PLAN:    ICD-10-CM   1. Posterior vitreous detachment of both eyes  H43.813 OCT, Retina - OU - Both Eyes    2. Vitreoretinal tuft of right eye  Q14.1 OCT, Retina - OU - Both Eyes    3. Right retinal defect  H33.301     4. Optic neuritis, left  H46.9     5. 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 PVD OU  - acute PVD OD with persistent flashes and floaters, onset around 5.5.23 -- symptoms stably improved  - OS w/ stable floaters  - Discussed findings and prognosis  - VR tufts w/ retinal breaks OD -- see below; OS without RT/RD  - No RD on 360 scleral depressed exam OD   - Reviewed s/s of RT/RD  2,3. Vitreoretinal tufts w/ retinal breaks OD   - tufts with breaks at 0900 and 0130 - s/p laser retinopexy OD (05.12.23) -- good laser surrounding - no new RT/RD or tufts - pt is cleared from a retina standpoint for release to Dr. Lucianne Lei and resumption of primary eye care   - pt can f/u here prn  4.  H/o Optic Neuritis OS   - onset ~25 years ago per pt  - Dr. Herbert Deaner diagnosed and treated   - denies history of MS  5.   Mixed Cataract OU - The symptoms of cataract, surgical options, and treatments and risks were discussed with patient. - discussed diagnosis and progression - under the expert care of Dr. Lucianne Lei - monitor  Ophthalmic Meds Ordered this visit:  No orders of the defined types were placed in this encounter.    Return if symptoms worsen or fail to improve.  There are no Patient Instructions on file for this visit.   Explained the diagnoses, plan, and follow up with the patient and they expressed understanding.  Patient expressed understanding of the importance of proper follow up care.   This document serves as a record of services personally performed by Gardiner Sleeper, MD, PhD. It was created on their behalf by Roselee Nova, COMT. The creation of this record is the provider's dictation and/or activities during the visit.  Electronically signed by: Roselee Nova, COMT 02/08/22 9:45 AM  This document serves  as a record of services personally performed by Gardiner Sleeper, MD, PhD. It was created on their behalf by Renaldo Reel, [place] an ophthalmic technician. The creation of this record is the provider's dictation and/or activities during the visit.    Electronically signed by:  Renaldo Reel, COT  02/08/22 9:45 AM   Gardiner Sleeper, M.D., Ph.D. Diseases & Surgery of the Retina and Vitreous [facility]  I have reviewed the above documentation for accuracy and completeness, and I agree with the above. Gardiner Sleeper, M.D., Ph.D. 02/08/22 9:45 AM  Abbreviations: M myopia (nearsighted); A astigmatism; H hyperopia (farsighted); P presbyopia; Mrx spectacle prescription;  CTL contact lenses; OD right eye; OS left eye; OU both eyes  XT exotropia; ET esotropia; PEK punctate epithelial keratitis; PEE punctate  epithelial erosions; DES dry eye syndrome; MGD meibomian gland dysfunction; ATs artificial tears; PFAT's preservative free artificial tears; Rye nuclear sclerotic cataract; PSC posterior subcapsular cataract; ERM epi-retinal membrane; PVD posterior vitreous detachment; RD retinal detachment; DM diabetes mellitus; DR diabetic retinopathy; NPDR non-proliferative diabetic retinopathy; PDR proliferative diabetic retinopathy; CSME clinically significant macular edema; DME diabetic macular edema; dbh dot blot hemorrhages; CWS cotton wool spot; POAG primary open angle glaucoma; C/D cup-to-disc ratio; HVF humphrey visual field; GVF goldmann visual field; OCT optical coherence tomography; IOP intraocular pressure; BRVO Branch retinal vein occlusion; CRVO central retinal vein occlusion; CRAO central retinal artery occlusion; BRAO branch retinal artery occlusion; RT retinal tear; SB scleral buckle; PPV pars plana vitrectomy; VH Vitreous hemorrhage; PRP panretinal laser photocoagulation; IVK intravitreal kenalog; VMT vitreomacular traction; MH Macular hole;  NVD neovascularization of the disc; NVE neovascularization elsewhere; AREDS age related eye disease study; ARMD age related macular degeneration; POAG primary open angle glaucoma; EBMD epithelial/anterior basement membrane dystrophy; ACIOL anterior chamber intraocular lens; IOL intraocular lens; PCIOL posterior chamber intraocular lens; Phaco/IOL phacoemulsification with intraocular lens placement; Cameron photorefractive keratectomy; LASIK laser assisted in situ keratomileusis; HTN hypertension; DM diabetes mellitus; COPD chronic obstructive pulmonary disease

## 2022-02-08 ENCOUNTER — Ambulatory Visit (INDEPENDENT_AMBULATORY_CARE_PROVIDER_SITE_OTHER): Payer: Medicare Other | Admitting: Ophthalmology

## 2022-02-08 ENCOUNTER — Encounter (INDEPENDENT_AMBULATORY_CARE_PROVIDER_SITE_OTHER): Payer: Self-pay | Admitting: Ophthalmology

## 2022-02-08 DIAGNOSIS — Q141 Congenital malformation of retina: Secondary | ICD-10-CM

## 2022-02-08 DIAGNOSIS — H469 Unspecified optic neuritis: Secondary | ICD-10-CM | POA: Diagnosis not present

## 2022-02-08 DIAGNOSIS — H43813 Vitreous degeneration, bilateral: Secondary | ICD-10-CM

## 2022-02-08 DIAGNOSIS — H25813 Combined forms of age-related cataract, bilateral: Secondary | ICD-10-CM

## 2022-02-08 DIAGNOSIS — H33301 Unspecified retinal break, right eye: Secondary | ICD-10-CM

## 2022-03-09 ENCOUNTER — Other Ambulatory Visit: Payer: Self-pay | Admitting: Internal Medicine

## 2022-03-11 NOTE — Telephone Encounter (Signed)
Requesting refill zolpidem (AMBIEN) 5 MG tablet

## 2022-03-12 NOTE — Telephone Encounter (Signed)
Can you advise on refill in PCP's absence?

## 2022-04-08 ENCOUNTER — Other Ambulatory Visit: Payer: Self-pay | Admitting: Family Medicine

## 2022-04-23 ENCOUNTER — Encounter: Payer: Self-pay | Admitting: Internal Medicine

## 2022-05-12 ENCOUNTER — Ambulatory Visit (INDEPENDENT_AMBULATORY_CARE_PROVIDER_SITE_OTHER): Payer: Medicare Other | Admitting: Adult Health

## 2022-05-12 ENCOUNTER — Encounter: Payer: Self-pay | Admitting: Adult Health

## 2022-05-12 VITALS — BP 120/80 | HR 85 | Temp 98.1°F | Ht 61.0 in | Wt 142.0 lb

## 2022-05-12 DIAGNOSIS — J019 Acute sinusitis, unspecified: Secondary | ICD-10-CM

## 2022-05-12 DIAGNOSIS — R051 Acute cough: Secondary | ICD-10-CM

## 2022-05-12 LAB — POCT RAPID STREP A (OFFICE): Rapid Strep A Screen: NEGATIVE

## 2022-05-12 LAB — POCT INFLUENZA A/B
Influenza A, POC: NEGATIVE
Influenza B, POC: NEGATIVE

## 2022-05-12 LAB — POC COVID19 BINAXNOW: SARS Coronavirus 2 Ag: NEGATIVE

## 2022-05-12 MED ORDER — HYDROCODONE BIT-HOMATROP MBR 5-1.5 MG/5ML PO SOLN: 5.0000 mL | Freq: Three times a day (TID) | ORAL | 0 refills | Status: DC | PRN

## 2022-05-12 MED ORDER — DOXYCYCLINE HYCLATE 100 MG PO CAPS: 100.0000 mg | ORAL_CAPSULE | Freq: Two times a day (BID) | ORAL | 0 refills | Status: DC

## 2022-05-12 NOTE — Progress Notes (Signed)
Subjective:    Patient ID: Marcia Irwin, female    DOB: 04-Feb-1956, 66 y.o.   MRN: [id number]  HPI 66 year old female who  has a past medical history of ANXIETY, SITUATIONAL (04/24/2008), Breast cyst, BREAST MASS (01/28/2010), Cough, persistent (06/20/2014), DIPLOPIA, HX OF (04/01/2008), GERD (gastroesophageal reflux disease) (2006), Headache(784.0), Tubal pregnancy (1993), Unspecified vitamin D deficiency (11/13/2007), and UTI (lower urinary tract infection).  She presents to the office today for an acute visit. Her symptoms started three weeks ago with a low grade fever (up to 101),  sore throat and cough that has been waxing and weaning and constant sinus pain and pressure. Cough is keeping her up at night.   Denies nausea and vomiting but has had some diarrhea.    Review of Systems See HPI   Past Medical History:  Diagnosis Date   ANXIETY, SITUATIONAL 04/24/2008   Qualifier: Diagnosis of  By: Regis Bill MD, Standley Brooking    Breast cyst    aspirated followed with imaging   BREAST MASS 01/28/2010   Qualifier: Diagnosis of  By: Charlett Blake MD, Stacey     Cough, persistent 06/20/2014   rx for bronchospasm pos pist infectious  dec airway irratilbity optimize flonase and   ppi for now  and fu if needed no hx of asthma nl exam excep dry cough sp    DIPLOPIA, HX OF 04/01/2008   Qualifier: Diagnosis of  By: Regis Bill MD, Standley Brooking    GERD (gastroesophageal reflux disease) 2006   with esophagitis and gi bleed    Headache(784.0)    episode diplopia eval neg mri work up   Tubal pregnancy 1993   Unspecified vitamin D deficiency 11/13/2007   Qualifier: Diagnosis of  By: Regis Bill MD, Standley Brooking    UTI (lower urinary tract infection)     Social History   Socioeconomic History   Marital status: Married    Spouse name: Not on file   Number of children: Not on file   Years of education: Not on file   Highest education level: Not on file  Occupational History   Not on file  Tobacco Use   Smoking status: Never    Smokeless tobacco: Never   Tobacco comments:    rare  Vaping Use   Vaping Use: Never used  Substance and Sexual Activity   Alcohol use: Yes    Alcohol/week: 3.0 standard drinks of alcohol    Types: 3 Glasses of wine per week   Drug use: No   Sexual activity: Not on file  Other Topics Concern   Not on file  Social History Narrative   Married   Regular exercise- no    HH of 2 except college  Age.   Pets cat and dog   Sleep   60 hours week  Travels.    Neg ets .   2-3 etoh per week.   Social Determinants of Health   Financial Resource Strain: Not on file  Food Insecurity: Not on file  Transportation Needs: Not on file  Physical Activity: Not on file  Stress: Not on file  Social Connections: Not on file  Intimate Partner Violence: Not on file    Past Surgical History:  Procedure Laterality Date   BREAST CYST ASPIRATION     BREAST ENHANCEMENT SURGERY     COLONOSCOPY     DILATION AND CURETTAGE OF UTERUS     x 2   PHOTOREFRACTIVE KERATOTOMY Right 2008   Dr. Herbert Deaner   TONSILLECTOMY  TRIGGER FINGER RELEASE  05/15/2015   right thumb weingold    TUBAL LIGATION     UPPER GASTROINTESTINAL ENDOSCOPY      Family History  Problem Relation Age of Onset   Cataracts Mother    Colon cancer Father 44   Heart attack Father    Glaucoma Maternal Grandmother    Colon cancer Maternal Grandfather    Breast cancer Other    Colon cancer Other    Diabetes Other    Hyperlipidemia Other    Hypertension Other    Stroke Other    Heart disease Other    Other Other        clots grandmother    No Known Allergies  Current Outpatient Medications on File Prior to Visit  Medication Sig Dispense Refill   Ascorbic Acid (VITAMIN C PO) Take 1 tablet by mouth daily.     cycloSPORINE (RESTASIS) 0.05 % ophthalmic emulsion Restasis 0.05 % eye drops in a dropperette     PROLIA 60 MG/ML SOSY injection Inject into the skin.     zolpidem (AMBIEN) 5 MG tablet TAKE 1 TABLET BY MOUTH EVERY DAY  AT BEDTIME AS NEEDED FOR INSOMNIA 30 tablet 2   No current facility-administered medications on file prior to visit.    Pulse 85   Temp 98.1 F (36.7 C) (Oral)   Ht '5\' 1"'$  (1.549 m)   Wt 142 lb (64.4 kg)   SpO2 99%   BMI 26.83 kg/m       Objective:   Physical Exam Vitals and nursing note reviewed.  Constitutional:      Appearance: Normal appearance.  HENT:     Nose: Congestion and rhinorrhea present. Rhinorrhea is purulent.     Right Turbinates: Enlarged and swollen.     Left Turbinates: Enlarged and swollen.     Right Sinus: Maxillary sinus tenderness and frontal sinus tenderness present.     Left Sinus: Maxillary sinus tenderness and frontal sinus tenderness present.     Mouth/Throat:     Pharynx: Oropharynx is clear. Uvula midline. No oropharyngeal exudate or posterior oropharyngeal erythema.  Cardiovascular:     Rate and Rhythm: Normal rate and regular rhythm.     Pulses: Normal pulses.     Heart sounds: Normal heart sounds.  Pulmonary:     Effort: Pulmonary effort is normal.     Breath sounds: Normal breath sounds.  Skin:    General: Skin is warm and dry.     Capillary Refill: Capillary refill takes less than 2 seconds.  Neurological:     General: No focal deficit present.     Mental Status: She is alert and oriented to person, place, and time.  Psychiatric:        Mood and Affect: Mood normal.        Behavior: Behavior normal.        Thought Content: Thought content normal.        Judgment: Judgment normal.       Assessment & Plan:   1. Acute sinusitis, recurrence not specified, unspecified location - Will treat for suspected sinusitis with doxy - Follow up if not improving in the next 2-3 days  - doxycycline (VIBRAMYCIN) 100 MG capsule; Take 1 capsule (100 mg total) by mouth 2 (two) times daily.  Dispense: 14 capsule; Refill: 0 - POC Rapid Strep A- negative - POC Influenza A/B- negative - POC COVID-19- negative   2. Acute cough  - HYDROcodone  bit-homatropine (HYCODAN) 5-1.5 MG/5ML syrup;  Take 5 mLs by mouth every 8 (eight) hours as needed for cough.  Dispense: 120 mL; Refill: 0 - POC Rapid Strep A - POC Influenza A/B - POC COVID-19  Dorothyann Peng, NP

## 2022-05-19 ENCOUNTER — Ambulatory Visit (AMBULATORY_SURGERY_CENTER): Payer: Medicare Other | Admitting: *Deleted

## 2022-05-19 VITALS — Ht 61.0 in | Wt 138.0 lb

## 2022-05-19 DIAGNOSIS — Z86010 Personal history of colonic polyps: Secondary | ICD-10-CM

## 2022-05-19 DIAGNOSIS — Z800 Family history of malignant neoplasm of digestive organs: Secondary | ICD-10-CM

## 2022-05-19 NOTE — Progress Notes (Signed)
No egg or soy allergy known to patient  No issues known to pt with past sedation with any surgeries or procedures Patient denies ever being told they had issues or difficulty with intubation  No FH of Malignant Hyperthermia Pt is not on diet pills Pt is not on  home 02  Pt is not on blood thinners  Pt denies issues with constipation  No A fib or A flutter Have any cardiac testing pending--NO Pt instructed to use Singlecare.com or GoodRx for a price reduction on prep    Sample sheet of over the counter items to purchase for prep given.  Patient's chart reviewed by Osvaldo Angst CNRA prior to previsit and patient appropriate for the [facility].  Previsit completed and red dot placed by patient's name on their procedure day (on provider's schedule).

## 2022-06-01 ENCOUNTER — Encounter: Payer: BC Managed Care – PPO | Admitting: Internal Medicine

## 2022-06-22 ENCOUNTER — Encounter: Payer: BC Managed Care – PPO | Admitting: Internal Medicine

## 2022-07-10 ENCOUNTER — Other Ambulatory Visit: Payer: Self-pay | Admitting: Internal Medicine

## 2022-07-12 ENCOUNTER — Encounter: Payer: Self-pay | Admitting: Certified Registered Nurse Anesthetist

## 2022-07-19 ENCOUNTER — Encounter: Payer: Self-pay | Admitting: Internal Medicine

## 2022-07-19 ENCOUNTER — Ambulatory Visit: Payer: Medicare Other | Admitting: Internal Medicine

## 2022-07-19 VITALS — BP 116/72 | HR 82 | Temp 96.9°F | Resp 14 | Ht 61.0 in | Wt 138.0 lb

## 2022-07-19 DIAGNOSIS — D124 Benign neoplasm of descending colon: Secondary | ICD-10-CM

## 2022-07-19 DIAGNOSIS — Z86010 Personal history of colonic polyps: Secondary | ICD-10-CM | POA: Diagnosis not present

## 2022-07-19 DIAGNOSIS — Z09 Encounter for follow-up examination after completed treatment for conditions other than malignant neoplasm: Secondary | ICD-10-CM | POA: Diagnosis not present

## 2022-07-19 DIAGNOSIS — Z800 Family history of malignant neoplasm of digestive organs: Secondary | ICD-10-CM | POA: Diagnosis not present

## 2022-07-19 MED ORDER — SODIUM CHLORIDE 0.9 % IV SOLN: 500.0000 mL | Freq: Once | INTRAVENOUS | Status: DC

## 2022-07-19 NOTE — Progress Notes (Unsigned)
1525 Robinul 0.2 mg IV given due large amount of secretions upon assessment.  MD made aware, vss

## 2022-07-19 NOTE — Patient Instructions (Addendum)
I found and removed one tiny polyp today. It looks benign.  I will let you know pathology results by mail and/or My Chart.  Your next routine colonoscopy should be in 5 years - 2029.  I appreciate the opportunity to care for you. Gatha Mayer, MD, [facility]  Recommendation:- Patient has a contact number available for                            emergencies. The signs and symptoms of potential                            delayed complications were discussed with the                            patient. Return to normal activities tomorrow.                            Written discharge instructions were provided to the                            patient.                           - Resume previous diet.                           - Continue present medications.                           - Await pathology results.                           - Repeat colonoscopy in 5 years.  Handout on polyps given.  YOU HAD AN ENDOSCOPIC PROCEDURE TODAY AT [facility]:   Refer to the procedure report that was given to you for any specific questions about what was found during the examination.  If the procedure report does not answer your questions, please call your gastroenterologist to clarify.  If you requested that your care partner not be given the details of your procedure findings, then the procedure report has been included in a sealed envelope for you to review at your convenience later.  YOU SHOULD EXPECT: Some feelings of bloating in the abdomen. Passage of more gas than usual.  Walking can help get rid of the air that was put into your GI tract during the procedure and reduce the bloating. If you had a lower endoscopy (such as a colonoscopy or flexible sigmoidoscopy) you may notice spotting of blood in your stool or on the toilet paper. If you underwent a bowel prep for your procedure, you may not have a normal bowel movement for a few days.  Please Note:  You might notice some irritation  and congestion in your nose or some drainage.  This is from the oxygen used during your procedure.  There is no need for concern and it should clear up in a day or so.  SYMPTOMS TO REPORT IMMEDIATELY:  Following lower endoscopy (colonoscopy or flexible sigmoidoscopy):  Excessive amounts of blood in the stool  Significant tenderness or worsening of abdominal pains  Swelling of  the abdomen that is new, acute  Fever of 100F or higher  For urgent or emergent issues, a gastroenterologist can be reached at any hour by calling [phone]. Do not use MyChart messaging for urgent concerns.   DIET:  We do recommend a small meal at first, but then you may proceed to your regular diet.  Drink plenty of fluids but you should avoid alcoholic beverages for 24 hours.  ACTIVITY:  You should plan to take it easy for the rest of today and you should NOT DRIVE or use heavy machinery until tomorrow (because of the sedation medicines used during the test).    FOLLOW UP: Our staff will call the number listed on your records the next business day following your procedure.  We will call around 7:15- 8:00 am to check on you and address any questions or concerns that you may have regarding the information given to you following your procedure. If we do not reach you, we will leave a message.     If any biopsies were taken you will be contacted by phone or by letter within the next 1-3 weeks.  Please call us at [phone] if you have not heard about the biopsies in 3 weeks.   SIGNATURES/CONFIDENTIALITY: You and/or your care partner have signed paperwork which will be entered into your electronic medical record.  These signatures attest to the fact that that the information above on your After Visit Summary has been reviewed and is understood.  Full responsibility of the confidentiality of this discharge information lies with you and/or your care-partner.

## 2022-07-19 NOTE — Progress Notes (Unsigned)
Called to room to assist during endoscopic procedure.  Patient ID and intended procedure confirmed with present staff. Received instructions for my participation in the procedure from the performing physician.  

## 2022-07-19 NOTE — Progress Notes (Unsigned)
[facility] History and Physical   Primary Care Physician:  Burnis Medin, MD   Reason for Procedure:   Family Hx CRCA + Hx polyps  Plan:    colonoscopy     HPI: Marcia Irwin is a 67 y.o. female for screening procedure  Father and paternal grandfather had CRCA Patient had 2 adenomas 2013, none in 2018 Past Medical History:  Diagnosis Date   Allergy    SEASONAL   ANXIETY, SITUATIONAL 04/24/2008   Qualifier: Diagnosis of  By: Regis Bill MD, Standley Brooking    Arthritis    FINGER   Breast cyst    aspirated followed with imaging   BREAST MASS 01/28/2010   Qualifier: Diagnosis of  By: Charlett Blake MD, Stacey     Cancer [facility])    SKIN,BASAL CELL   Cough, persistent 06/20/2014   rx for bronchospasm pos pist infectious  dec airway irratilbity optimize flonase and   ppi for now  and fu if needed no hx of asthma nl exam excep dry cough sp    DIPLOPIA, HX OF 04/01/2008   Qualifier: Diagnosis of  By: Regis Bill MD, Standley Brooking    GERD (gastroesophageal reflux disease) 2006   with esophagitis and gi bleed    Headache(784.0)    episode diplopia eval neg mri work up   Osteopenia    Tubal pregnancy 1993   Unspecified vitamin D deficiency 11/13/2007   Qualifier: Diagnosis of  By: Regis Bill MD, Standley Brooking    UTI (lower urinary tract infection)     Past Surgical History:  Procedure Laterality Date   BREAST CYST ASPIRATION     BREAST ENHANCEMENT SURGERY     COLONOSCOPY     DILATION AND CURETTAGE OF UTERUS     x 2   PHOTOREFRACTIVE KERATOTOMY Right 2008   Dr. Herbert Deaner   TONSILLECTOMY     TRIGGER FINGER RELEASE  05/15/2015   right thumb weingold    TUBAL LIGATION     UPPER GASTROINTESTINAL ENDOSCOPY      Prior to Admission medications   Medication Sig Start Date End Date Taking? Authorizing Provider  zolpidem (AMBIEN) 5 MG tablet TAKE 1 TABLET BY MOUTH AT BEDTIME AS NEEDED FOR INSOMNIA 07/11/22  Yes Dutch Quint B, FNP  PROLIA 60 MG/ML SOSY injection Inject into the skin. 01/30/20    [provider]    Current Outpatient Medications  Medication Sig Dispense Refill   zolpidem (AMBIEN) 5 MG tablet TAKE 1 TABLET BY MOUTH AT BEDTIME AS NEEDED FOR INSOMNIA 30 tablet 2   PROLIA 60 MG/ML SOSY injection Inject into the skin.     Current Facility-Administered Medications  Medication Dose Route Frequency Provider Last Rate Last Admin   0.9 %  sodium chloride infusion  500 mL Intravenous Once Gatha Mayer, MD        Allergies as of 07/19/2022   (No Known Allergies)    Family History  Problem Relation Age of Onset   Cataracts Mother    Colon cancer Father 22   Heart attack Father    Glaucoma Maternal Grandmother    Colon cancer Maternal Grandfather    Breast cancer Other    Colon cancer Other    Diabetes Other    Hyperlipidemia Other    Hypertension Other    Stroke Other    Heart disease Other    Other Other        clots grandmother   Colon polyps Neg Hx    Crohn's disease Neg  Hx    Esophageal cancer Neg Hx    Stomach cancer Neg Hx    Ulcerative colitis Neg Hx    Uterine cancer Neg Hx    Rectal cancer Neg Hx     Social History   Socioeconomic History   Marital status: Married    Spouse name: Not on file   Number of children: Not on file   Years of education: Not on file   Highest education level: Not on file  Occupational History   Not on file  Tobacco Use   Smoking status: Never    Passive exposure: Past ("YOUNGER")   Smokeless tobacco: Never   Tobacco comments:    rare  Vaping Use   Vaping Use: Never used  Substance and Sexual Activity   Alcohol use: Yes    Alcohol/week: 3.0 standard drinks of alcohol    Types: 3 Glasses of wine per week    Comment: 2 PER WEEK   Drug use: No   Sexual activity: Not on file  Other Topics Concern   Not on file  Social History Narrative   Married   Regular exercise- no    HH of 2 except college  Age.   Pets cat and dog   Sleep   60 hours week  Travels.    Neg ets .   2-3 etoh per week.    Social Determinants of Health   Financial Resource Strain: Not on file  Food Insecurity: Not on file  Transportation Needs: Not on file  Physical Activity: Not on file  Stress: Not on file  Social Connections: Not on file  Intimate Partner Violence: Not on file    Review of Systems:  All other review of systems negative except as mentioned in the HPI.  Physical Exam: Vital signs BP 121/70   Pulse 70   Temp (!) 96.9 F (36.1 C) (Temporal)   Resp 12   Ht '5\' 1"'$  (1.549 m)   Wt 138 lb (62.6 kg)   SpO2 100%   BMI 26.07 kg/m   General:   Alert,  Well-developed, well-nourished, pleasant and cooperative in NAD Lungs:  Clear throughout to auscultation.   Heart:  Regular rate and rhythm; no murmurs, clicks, rubs,  or gallops. Abdomen:  Soft, nontender and nondistended. Normal bowel sounds.   Neuro/Psych:  Alert and cooperative. Normal mood and affect. A and O x 3   '@Eward Rutigliano'$  Simonne Maffucci, MD, [facility] [phone] (pager) 07/19/2022 3:11 PM@

## 2022-07-19 NOTE — Op Note (Signed)
[facility] Patient Name: Marcia Irwin Procedure Date: 07/19/2022 3:09 PM MRN: [id number] Endoscopist: Gatha Mayer , MD, [id number] Age: 67 Referring MD:  Date of Birth: [birth year] Gender: Female Account #: [id number] Procedure:                Colonoscopy Indications:              Surveillance: Personal history of adenomatous                            polyps on last colonoscopy > 5 years ago, Last                            colonoscopy: 2018 Medicines:                Monitored Anesthesia Care Procedure:                Pre-Anesthesia Assessment:                           - Prior to the procedure, a History and Physical                            was performed, and patient medications and                            allergies were reviewed. The patient's tolerance of                            previous anesthesia was also reviewed. The risks                            and benefits of the procedure and the sedation                            options and risks were discussed with the patient.                            All questions were answered, and informed consent                            was obtained. Prior Anticoagulants: The patient has                            taken no anticoagulant or antiplatelet agents. ASA                            Grade Assessment: II - A patient with mild systemic                            disease. After reviewing the risks and benefits,                            the patient was deemed in satisfactory condition to  undergo the procedure.                           After obtaining informed consent, the colonoscope                            was passed under direct vision. Throughout the                            procedure, the patient's blood pressure, pulse, and                            oxygen saturations were monitored continuously. The                            Olympus PCF-H190DL (#[id number]) Colonoscope  was                            introduced through the anus and advanced to the the                            cecum, identified by appendiceal orifice and                            ileocecal valve. The colonoscopy was somewhat                            difficult due to significant looping. Successful                            completion of the procedure was aided by applying                            abdominal pressure. The patient tolerated the                            procedure well. The quality of the bowel                            preparation was excellent. The ileocecal valve,                            appendiceal orifice, and rectum were photographed.                            The bowel preparation used was Miralax via split                            dose instruction. Scope In: Scope Out: 3:32:44 PM Scope Withdrawal Time: 0 hours 9 minutes 57 seconds  Findings:                 The perianal and digital rectal examinations were  normal.                           A diminutive polyp was found in the descending                            colon. The polyp was sessile. The polyp was removed                            with a cold snare. Resection and retrieval were                            complete. Verification of patient identification                            for the specimen was done. Estimated blood loss was                            minimal.                           The exam was otherwise without abnormality on                            direct and retroflexion views. Complications:            No immediate complications. Estimated Blood Loss:     Estimated blood loss was minimal. Impression:               - One diminutive polyp in the descending colon,                            removed with a cold snare. Resected and retrieved.                           - The examination was otherwise normal on direct                            and  retroflexion views.                           - Personal history of colonic polyps 2 adenomas                            2013 and FHx CRCA father(560 and paternal                            grandfather Recommendation:           - Patient has a contact number available for                            emergencies. The signs and symptoms of potential                            delayed complications were discussed with the  patient. Return to normal activities tomorrow.                            Written discharge instructions were provided to the                            patient.                           - Resume previous diet.                           - Continue present medications.                           - Await pathology results.                           - Repeat colonoscopy in 5 years. Gatha Mayer, MD 07/19/2022 3:38:28 PM This report has been signed electronically.

## 2022-07-19 NOTE — Progress Notes (Unsigned)
VS completed by CW.   Pt's states no medical or surgical changes since previsit or office visit.  

## 2022-07-19 NOTE — Progress Notes (Unsigned)
Report given to PACU, vss 

## 2022-07-20 ENCOUNTER — Telehealth: Payer: Self-pay

## 2022-07-20 NOTE — Telephone Encounter (Signed)
Follow up call to pt, lm for pt to call if having any difficulty with normal activities or eating and drinking.  Also to call if any other questions or concerns.  

## 2022-07-25 ENCOUNTER — Encounter: Payer: Self-pay | Admitting: Internal Medicine

## 2022-09-21 ENCOUNTER — Encounter: Payer: Medicare Other | Admitting: Internal Medicine

## 2022-10-11 ENCOUNTER — Telehealth: Payer: Self-pay | Admitting: Internal Medicine

## 2022-10-11 ENCOUNTER — Other Ambulatory Visit: Payer: Self-pay | Admitting: Family

## 2022-10-11 NOTE — Telephone Encounter (Signed)
Pt called to reschedule her WTM appointment, stating she won't be available.  Pt then opted to cancel said appt instead, stating she would call back to reschedule.  Pt would also like to know if this WTM appointment is mandatory because, as she states, it is a waste of time and money.   Pt would like confirmation on whether this appointment is mandatory or not?   Please advise.

## 2022-10-12 ENCOUNTER — Encounter: Payer: Medicare Other | Admitting: Internal Medicine

## 2022-10-12 NOTE — Telephone Encounter (Signed)
Spoke to pt. Pt states she canceled WTM appointment.

## 2022-10-13 ENCOUNTER — Telehealth: Payer: Self-pay | Admitting: Internal Medicine

## 2022-10-13 MED ORDER — ZOLPIDEM TARTRATE 5 MG PO TABS
ORAL_TABLET | ORAL | 0 refills | Status: DC
Start: 2022-10-13 — End: 2022-10-26

## 2022-10-13 NOTE — Telephone Encounter (Signed)
Prescription Request  10/13/2022  LOV: 05/12/2022 = Acute Visit - Saw NP Lennox Grumbles.  What is the name of the medication or equipment? zolpidem (AMBIEN) 5 MG tablet   Have you contacted your pharmacy to request a refill? Yes   Pt states she is almost out of this medication. Pt was offered an OV, but stated she has been taking this medication for a while and will come in if MD really needs her to. Please advise - Mobile [phone] (mobile)   Which pharmacy would you like this sent to?   [id number] [place], [place] - [address] Phone: [phone]  Fax: [phone]       Patient notified that their request is being sent to the clinical staff for review and that they should receive a response within 2-3 business days.

## 2022-10-21 ENCOUNTER — Telehealth: Payer: Self-pay | Admitting: Internal Medicine

## 2022-10-21 NOTE — Telephone Encounter (Signed)
Called patient to schedule Medicare Annual Wellness Visit (AWV). Left message for patient to call back and schedule Medicare Annual Wellness Visit (AWV).  Last date of AWV: awvi 10/13/22 per [facility]  Please schedule an appointment at any time with [facility].  If any questions, please contact me at [phone].  Thank you ,  Solymar Grace [facility] AWV direct phone # [phone] 

## 2022-10-22 ENCOUNTER — Telehealth: Payer: Self-pay | Admitting: Internal Medicine

## 2022-10-22 NOTE — Telephone Encounter (Signed)
Called patient to schedule Medicare Annual Wellness Visit (AWV). Left message for patient to call back and schedule Medicare Annual Wellness Visit (AWV).  Last date of AWV: awvi 10/13/22 per [facility]   Please transfer to Shanon Becvar @ [phone]  Thank you ,  Rudell Cobb AWV direct phone # [phone]   Returned patients call from 10/21/22

## 2022-10-26 ENCOUNTER — Encounter: Payer: Self-pay | Admitting: Internal Medicine

## 2022-10-26 ENCOUNTER — Telehealth (INDEPENDENT_AMBULATORY_CARE_PROVIDER_SITE_OTHER): Payer: Medicare Other | Admitting: Internal Medicine

## 2022-10-26 ENCOUNTER — Other Ambulatory Visit: Payer: Self-pay | Admitting: Internal Medicine

## 2022-10-26 VITALS — HR 99 | Ht 61.0 in | Wt 132.0 lb

## 2022-10-26 DIAGNOSIS — E785 Hyperlipidemia, unspecified: Secondary | ICD-10-CM | POA: Diagnosis not present

## 2022-10-26 DIAGNOSIS — J029 Acute pharyngitis, unspecified: Secondary | ICD-10-CM | POA: Diagnosis not present

## 2022-10-26 DIAGNOSIS — G4700 Insomnia, unspecified: Secondary | ICD-10-CM | POA: Diagnosis not present

## 2022-10-26 DIAGNOSIS — Z79899 Other long term (current) drug therapy: Secondary | ICD-10-CM | POA: Diagnosis not present

## 2022-10-26 DIAGNOSIS — E559 Vitamin D deficiency, unspecified: Secondary | ICD-10-CM

## 2022-10-26 DIAGNOSIS — M810 Age-related osteoporosis without current pathological fracture: Secondary | ICD-10-CM

## 2022-10-26 MED ORDER — ZOLPIDEM TARTRATE 5 MG PO TABS
ORAL_TABLET | ORAL | 2 refills | Status: DC
Start: 2022-10-26 — End: 2023-02-15

## 2022-10-26 NOTE — Progress Notes (Signed)
Future orders fasting then yearly visit

## 2022-10-26 NOTE — Progress Notes (Signed)
Virtual Visit via Video Note  I connected with [name] on 10/26/22 at  8:30 AM EDT by a video enabled telemedicine application and verified that I am speaking with the correct person using two identifiers. Location patient: home Location provider:work  office Persons participating in the virtual visit: patient, provider  Patient aware  of the limitations of evaluation and management by telemedicine and  availability of in person appointments. and agreed to proceed.   HPI: Marcia Irwin presents for video visit  Sore throat   this weekend ? Ear pressure .  Chills  yesterday . No fever thought pollen sdoesnt think strep no real cough congestion . Recent travel and busy  weekend   Working FT  Ambien  taking q night and less dosage and when travel. Use as time zone changes   Take small amount.  Weekly travel in past jan .  Reg exercise wlaking plus  when well No tobacco  Etoh. Social  V  calcium and vit  still on prolica  ROS: See pertinent positives and negatives per HPI. No cv pulm sx  Father had MI 72's    ( had colon cancer )  PGF   had cv  UTd on colonoscopy  Past Medical History:  Diagnosis Date   Allergy    SEASONAL   ANXIETY, SITUATIONAL 04/24/2008   Qualifier: Diagnosis of  By: Fabian Sharp MD, Neta Mends    Arthritis    FINGER   Breast cyst    aspirated followed with imaging   BREAST MASS 01/28/2010   Qualifier: Diagnosis of  By: Abner Greenspan MD, Stacey     Cancer [facility])    SKIN,BASAL CELL   Cough, persistent 06/20/2014   rx for bronchospasm pos pist infectious  dec airway irratilbity optimize flonase and   ppi for now  and fu if needed no hx of asthma nl exam excep dry cough sp    DIPLOPIA, HX OF 04/01/2008   Qualifier: Diagnosis of  By: Fabian Sharp MD, Neta Mends    GERD (gastroesophageal reflux disease) 2006   with esophagitis and gi bleed    Headache(784.0)    episode diplopia eval neg mri work up   Osteopenia    Tubal pregnancy 1993   Unspecified vitamin D  deficiency 11/13/2007   Qualifier: Diagnosis of  By: Fabian Sharp MD, Neta Mends    UTI (lower urinary tract infection)     Past Surgical History:  Procedure Laterality Date   BREAST CYST ASPIRATION     BREAST ENHANCEMENT SURGERY     COLONOSCOPY     DILATION AND CURETTAGE OF UTERUS     x 2   PHOTOREFRACTIVE KERATOTOMY Right 2008   Dr. Elmer Picker   TONSILLECTOMY     TRIGGER FINGER RELEASE  05/15/2015   right thumb weingold    TUBAL LIGATION     UPPER GASTROINTESTINAL ENDOSCOPY      Family History  Problem Relation Age of Onset   Cataracts Mother    Colon cancer Father 46   Heart attack Father    Glaucoma Maternal Grandmother    Colon cancer Paternal Grandfather    Breast cancer Other    Colon cancer Other    Diabetes Other    Hyperlipidemia Other    Hypertension Other    Stroke Other    Heart disease Other    Other Other        clots grandmother   Colon polyps Neg Hx    Crohn's disease Neg Hx  Esophageal cancer Neg Hx    Stomach cancer Neg Hx    Ulcerative colitis Neg Hx    Uterine cancer Neg Hx    Rectal cancer Neg Hx     Social History   Tobacco Use   Smoking status: Never    Passive exposure: Past ("YOUNGER")   Smokeless tobacco: Never   Tobacco comments:    rare  Vaping Use   Vaping Use: Never used  Substance Use Topics   Alcohol use: Yes    Alcohol/week: 3.0 standard drinks of alcohol    Types: 3 Glasses of wine per week    Comment: 2 PER WEEK   Drug use: No      Current Outpatient Medications:    PROLIA 60 MG/ML SOSY injection, Inject into the skin., Disp: , Rfl:    zolpidem (AMBIEN) 5 MG tablet, TAKE 1 TABLET BY MOUTH AT BEDTIME AS NEEDED FOR INSOMNIA, Disp: 30 tablet, Rfl: 2  EXAM: BP Readings from Last 3 Encounters:  07/19/22 116/72  05/12/22 120/80  12/14/21 108/78    VITALS per patient if applicable: temp check normal  forehead   GENERAL: alert, oriented, appears well and in no acute distress  HEENT: atraumatic, conjunttiva clear, no  obvious abnormalities on inspection of external nose and ears NECK: normal movements of the head and neck LUNGS: on inspection no signs of respiratory distress, breathing rate appears normal, no obvious gross SOB, gasping or wheezing CV: no obvious cyanosis PSYCH/NEURO: pleasant and cooperative, no obvious depression or anxiety, speech and thought processing grossly intact Lab Results  Component Value Date   WBC 6.8 12/14/2021   HGB 13.5 12/14/2021   HCT 40.2 12/14/2021   PLT 253.0 12/14/2021   GLUCOSE 92 12/14/2021   CHOL 253 (H) 12/14/2021   TRIG 100.0 12/14/2021   HDL 57.80 12/14/2021   LDLDIRECT 128.0 08/01/2018   LDLCALC 175 (H) 12/14/2021   ALT 16 10/31/2012   AST 15 10/31/2012   NA 139 12/14/2021   K 4.2 12/14/2021   CL 106 12/14/2021   CREATININE 0.86 12/14/2021   BUN 19 12/14/2021   CO2 25 12/14/2021   TSH 1.15 12/14/2021   HGBA1C 5.4 02/05/2016    ASSESSMENT AND PLAN:  Discussed the following assessment and plan:    ICD-10-CM   1. Insomnia, unspecified type  G47.00     2. Sore throat  J02.9    prob viral prodrome  sx supportive care  expectant managment    3. Medication management  Z79.899     4. Hyperlipidemia, unspecified hyperlipidemia type  E78.5    cacscore zero l8 23   monitor      Counseled.  Medication continue refill 90 days  Plan lab in summer will order future labs and then visit fu physical med eval medical   Expectant management and discussion of plan and treatment with opportunity to ask questions and all were answered. The patient agreed with the plan and demonstrated an understanding of the instructions.   Advised to call back or seek an in-person evaluation if worsening  or having  further concerns  in interim. No follow-ups on file.    Berniece Andreas, MD

## 2022-10-26 NOTE — Assessment & Plan Note (Signed)
Refill benefit more than riskesp when travels

## 2023-01-03 ENCOUNTER — Ambulatory Visit (INDEPENDENT_AMBULATORY_CARE_PROVIDER_SITE_OTHER): Payer: Medicare Other

## 2023-01-03 ENCOUNTER — Encounter: Payer: Self-pay | Admitting: Family Medicine

## 2023-01-03 ENCOUNTER — Ambulatory Visit (INDEPENDENT_AMBULATORY_CARE_PROVIDER_SITE_OTHER): Payer: Medicare Other | Admitting: Family Medicine

## 2023-01-03 ENCOUNTER — Ambulatory Visit: Payer: Medicare Other | Admitting: Family Medicine

## 2023-01-03 VITALS — BP 118/72 | HR 80 | Temp 98.2°F | Wt 135.2 lb

## 2023-01-03 DIAGNOSIS — M25551 Pain in right hip: Secondary | ICD-10-CM

## 2023-01-03 NOTE — Progress Notes (Signed)
   Subjective:    Patient ID: Marcia Irwin, female    DOB: 1956-02-15, 67 y.o.   MRN: [id number]  HPI Here for several weeks of sharp pains in the right hip. No hx of trauma. The pain is mostly in the anterior hip area, but sometimes it radiates down the anterior right thigh. No back pain. No numbness or tingling in the legs or feet.    Review of Systems  Constitutional: Negative.   Respiratory: Negative.    Cardiovascular: Negative.   Musculoskeletal:  Positive for arthralgias.       Objective:   Physical Exam Constitutional:      Appearance: Normal appearance.     Comments: She walks comfortably   Cardiovascular:     Rate and Rhythm: Normal rate and regular rhythm.     Pulses: Normal pulses.     Heart sounds: Normal heart sounds.  Pulmonary:     Effort: Pulmonary effort is normal.     Breath sounds: Normal breath sounds.  Musculoskeletal:     Comments: No tenderness in the back. The spine has full ROM. No tenderness in the right hip. This hip has full ROM but she has some pain with full internal and external rotation   Neurological:     Mental Status: She is alert.           Assessment & Plan:  Right hip pain, most likely due to OA. We will get Xrays of the hip today. She can 1 or 2 Aleve tablets BID as needed for the pain.  Gershon Crane, MD

## 2023-01-06 ENCOUNTER — Encounter: Payer: Self-pay | Admitting: Family Medicine

## 2023-01-06 DIAGNOSIS — G8929 Other chronic pain: Secondary | ICD-10-CM

## 2023-01-14 NOTE — Telephone Encounter (Signed)
I did the referral to Orthopedics  

## 2023-02-01 ENCOUNTER — Telehealth: Payer: Medicare Other | Admitting: Family Medicine

## 2023-02-01 VITALS — Wt 129.0 lb

## 2023-02-01 DIAGNOSIS — Z0000 Encounter for general adult medical examination without abnormal findings: Secondary | ICD-10-CM

## 2023-02-01 NOTE — Patient Instructions (Signed)
I really enjoyed getting to talk with you today! I am available on Tuesdays and Thursdays for virtual visits if you have any questions or concerns, or if I can be of any further assistance.   CHECKLIST FROM ANNUAL WELLNESS VISIT:  -Follow up (please call to schedule if not scheduled after visit):   -yearly for annual wellness visit with primary care office  Here is a list of your preventive care/health maintenance measures and the plan for each if any are due:  PLAN For any measures below that may be due:   Health Maintenance  Topic Date Due   Hepatitis C Screening  Never done   COVID-19 Vaccine (1) 02/17/2024 (Originally 05/27/1961)   INFLUENZA VACCINE  09/11/2024 (Originally 01/13/2023)   MAMMOGRAM  05/15/2023   Medicare Annual Wellness (AWV)  02/01/2024   DTaP/Tdap/Td (4 - Td or Tdap) 02/04/2026   Colonoscopy  07/20/2027   Pneumonia Vaccine 74+ Years old  Completed   DEXA SCAN  Completed   Zoster Vaccines- Shingrix  Completed   HPV VACCINES  Aged Out    -See a dentist at least yearly  -Get your eyes checked and then per your eye specialist's recommendations  -Other issues addressed today:   -I have included below further information regarding a healthy whole foods based diet, physical activity guidelines for adults, stress management and opportunities for social connections. I hope you find this information useful.   -----------------------------------------------------------------------------------------------------------------------------------------------------------------------------------------------------------------------------------------------------------  NUTRITION: -eat real food: lots of colorful vegetables (half the plate) and fruits -5-7 servings of vegetables and fruits per day (fresh or steamed is best), exp. 2 servings of vegetables with lunch and dinner and 2 servings of fruit per day. Berries and greens such as kale and collards are great choices.   -consume on a regular basis: whole grains (make sure first ingredient on label contains the word "whole"), fresh fruits, fish, nuts, seeds, healthy oils (such as olive oil, avocado oil, grape seed oil) -may eat small amounts of dairy and lean meat on occasion, but avoid processed meats such as ham, bacon, lunch meat, etc. -drink water -try to avoid fast food and pre-packaged foods, processed meat -most experts advise limiting sodium to < 2300mg  per day, should limit further is any chronic conditions such as high blood pressure, heart disease, diabetes, etc. The American Heart Association advised that < 1500mg  is is ideal -try to avoid foods that contain any ingredients with names you do not recognize  -try to avoid sugar/sweets (except for the natural sugar that occurs in fresh fruit) -try to avoid sweet drinks -try to avoid white rice, white bread, pasta (unless whole grain), white or yellow potatoes  EXERCISE GUIDELINES FOR ADULTS: -if you wish to increase your physical activity, do so gradually and with the approval of your doctor -STOP and seek medical care immediately if you have any chest pain, chest discomfort or trouble breathing when starting or increasing exercise  -move and stretch your body, legs, feet and arms when sitting for long periods -Physical activity guidelines for optimal health in adults: -least 150 minutes per week of aerobic exercise (can talk, but not sing) once approved by your doctor, 20-30 minutes of sustained activity or two 10 minute episodes of sustained activity every day.  -resistance training at least 2 days per week if approved by your doctor -balance exercises 3+ days per week:   Stand somewhere where you have something sturdy to hold onto if you lose balance.    1) lift up on toes,  start with 5x per day and work up to 20x   2) stand and lift on leg straight out to the side so that foot is a few inches of the floor, start with 5x each side and work up to 20x  each side   3) stand on one foot, start with 5 seconds each side and work up to 20 seconds on each side  If you need ideas or help with getting more active:  -Silver sneakers https://tools.silversneakers.com  -Walk with a Doc: http://www.duncan-williams.com/  -try to include resistance (weight lifting/strength building) and balance exercises twice per week: or the following link for ideas: http://castillo-powell.com/  BuyDucts.dk  STRESS MANAGEMENT: -can try meditating, or just sitting quietly with deep breathing while intentionally relaxing all parts of your body for 5 minutes daily -if you need further help with stress, anxiety or depression please follow up with your primary doctor or contact the wonderful folks at [facility]: [phone]  SOCIAL CONNECTIONS: -options in [place] if you wish to engage in more social and exercise related activities:  -Silver sneakers https://tools.silversneakers.com  -Walk with a Doc: http://www.duncan-williams.com/  -Check out the [facility] Active Adults 50+ section on the [place] of [employer] (hiking clubs, book clubs, cards and games, chess, exercise classes, aquatic classes and much more) - see the website for details: https://www.-.gov/departments/parks-recreation/active-adults50  -YouTube has lots of exercise videos for different ages and abilities as well  -[facility] (a variety of indoor and outdoor inperson activities for adults). [phone]. [address]  -Virtual Online Classes (a variety of topics): see seniorplanet.org or call [phone]  -consider volunteering at a school, hospice center, church, senior center or elsewhere

## 2023-02-01 NOTE — Progress Notes (Signed)
PATIENT CHECK-IN and HEALTH RISK ASSESSMENT QUESTIONNAIRE:  -completed by phone/video for upcoming Medicare Preventive Visit  Pre-Visit Check-in: 1)Vitals (height, wt, BP, etc) - record in vitals section for visit on day of visit Request home vitals (wt, BP, etc.) and enter into vitals, THEN update Vital Signs SmartPhrase below at the top of the HPI. See below.  2)Review and Update Medications, Allergies PMH, Surgeries, Social history in Epic 3)Hospitalizations in the last year with date/reason? none 4)Review and Update Care Team (patient's specialists) in Epic 5) Complete PHQ9 in Epic  6) Complete Fall Screening in Epic 7)Review all Health Maintenance Due and order under PCP if not done.  8)Medicare Wellness Questionnaire: Answer theses question about your habits: Do you drink alcohol? On occasion, rare 1-2 drink Have you ever smoked? no Do you use an illicit drugs? no Do you exercises? Walking - fast; 10 miles per week, has some hand weights she uses at her Are you sexually active? 1 partner, no concerns for STI She feels her diet is pretty is good, tries to eat more fruits and veggies and proteins  Answer theses question about you: Can you perform most household chores? No  Any issues with hearing: no Do you feel that you have a problem with memory?no Do you balance your checkbook and or bank acounts?yes Do you need assistance with any of the following: Please note if so : none   Driving?  Feeding yourself?  Getting from bed to chair?  Getting to the toilet?  Bathing or showering?  Dressing yourself?  Managing money?  Climbing a flight of stairs  Preparing meals?  Do you have Advanced Directives in place (Living Will, Healthcare Power or Attorney)?  Yes   Last eye Exam and location? Gets eye exam at least yearly   Do you currently use prescribed or non-prescribed narcotic or opioid pain medications? no  Do you have a history or close family history of breast,  ovarian, tubal or peritoneal cancer or a family member with BRCA (breast cancer susceptibility 1 and 2) gene mutations? None known.  Request home vitals (wt, BP, etc.) and enter into vitals, THEN update Vital Signs SmartPhrase below at the top of the HPI. See below.   Nurse/Assistant Credentials/time stamp:   ----------------------------------------------------------------------------------------------------------------------------------------------------------------------------------------------------------------------  Vital Signs: Vital signs are patient reported.   MEDICARE ANNUAL PREVENTIVE VISIT WITH PROVIDER: (Welcome to Medicare, initial annual wellness or annual wellness exam)  Virtual Visit via Video Note  I connected with Marcia Irwin on 02/01/23 by a video enabled telemedicine application and verified that I am speaking with the correct person using two identifiers.  Location patient: home Location provider:work or home office Persons participating in the virtual visit: patient, provider  Concerns and/or follow up today: working a lot, enjoying work.    See HM section in Epic for other details of completed HM.    ROS: negative for report of fevers, unintentional weight loss, vision changes, vision loss, hearing loss or change, chest pain, sob, hemoptysis, melena, hematochezia, hematuria, falls, bleeding or bruising, thoughts of suicide or self harm, memory loss  Patient-completed extensive health risk assessment - reviewed and discussed with the patient: See Health Risk Assessment completed with patient prior to the visit either above or in recent phone note. This was reviewed in detailed with the patient today and appropriate recommendations, orders and referrals were placed as needed per Summary below and patient instructions.   Review of Medical History: -PMH, PSH, Family History and current specialty and care providers  reviewed and updated and listed below    Patient Care Team: Panosh, Neta Mends, MD as PCP - General Leone Payor Maryjean Morn, MD as Consulting Physician (Gastroenterology) Mitchel Honour, DO as Consulting Physician (Obstetrics and Gynecology)   Past Medical History:  Diagnosis Date   Allergy    SEASONAL   ANXIETY, SITUATIONAL 04/24/2008   Qualifier: Diagnosis of  By: Fabian Sharp MD, Neta Mends    Arthritis    FINGER   Breast cyst    aspirated followed with imaging   BREAST MASS 01/28/2010   Qualifier: Diagnosis of  By: Abner Greenspan MD, Stacey     Cancer [facility])    SKIN,BASAL CELL   Cough, persistent 06/20/2014   rx for bronchospasm pos pist infectious  dec airway irratilbity optimize flonase and   ppi for now  and fu if needed no hx of asthma nl exam excep dry cough sp    DIPLOPIA, HX OF 04/01/2008   Qualifier: Diagnosis of  By: Fabian Sharp MD, Neta Mends    GERD (gastroesophageal reflux disease) 2006   with esophagitis and gi bleed    Headache(784.0)    episode diplopia eval neg mri work up   Osteopenia    Tubal pregnancy 1993   Unspecified vitamin D deficiency 11/13/2007   Qualifier: Diagnosis of  By: Fabian Sharp MD, Neta Mends    UTI (lower urinary tract infection)     Past Surgical History:  Procedure Laterality Date   BREAST CYST ASPIRATION     BREAST ENHANCEMENT SURGERY     COLONOSCOPY     DILATION AND CURETTAGE OF UTERUS     x 2   PHOTOREFRACTIVE KERATOTOMY Right 2008   Dr. Elmer Picker   TONSILLECTOMY     TRIGGER FINGER RELEASE  05/15/2015   right thumb weingold    TUBAL LIGATION     UPPER GASTROINTESTINAL ENDOSCOPY      Social History   Socioeconomic History   Marital status: Married    Spouse name: Not on file   Number of children: Not on file   Years of education: Not on file   Highest education level: Not on file  Occupational History   Not on file  Tobacco Use   Smoking status: Never    Passive exposure: Past ("YOUNGER")   Smokeless tobacco: Never   Tobacco comments:    rare  Vaping Use   Vaping status: Never Used  Substance  and Sexual Activity   Alcohol use: Yes    Alcohol/week: 3.0 standard drinks of alcohol    Types: 3 Glasses of wine per week    Comment: 2 PER WEEK   Drug use: No   Sexual activity: Not on file  Other Topics Concern   Not on file  Social History Narrative   Married   Regular exercise- no    HH of 2 except college  Age.   Pets cat and dog   Sleep   60 hours week  Travels.    Neg ets .   2-3 etoh per week.   Social Determinants of Health   Financial Resource Strain: Not on file  Food Insecurity: Not on file  Transportation Needs: Not on file  Physical Activity: Not on file  Stress: Not on file  Social Connections: Not on file  Intimate Partner Violence: Not on file    Family History  Problem Relation Age of Onset   Cataracts Mother    Colon cancer Father 57   Heart attack Father    Glaucoma Maternal  Grandmother    Colon cancer Paternal Grandfather    Breast cancer Other    Colon cancer Other    Diabetes Other    Hyperlipidemia Other    Hypertension Other    Stroke Other    Heart disease Other    Other Other        clots grandmother   Colon polyps Neg Hx    Crohn's disease Neg Hx    Esophageal cancer Neg Hx    Stomach cancer Neg Hx    Ulcerative colitis Neg Hx    Uterine cancer Neg Hx    Rectal cancer Neg Hx     Current Outpatient Medications on File Prior to Visit  Medication Sig Dispense Refill   PROLIA 60 MG/ML SOSY injection Inject into the skin.     zolpidem (AMBIEN) 5 MG tablet TAKE 1 TABLET BY MOUTH AT BEDTIME AS NEEDED FOR INSOMNIA 30 tablet 2   No current facility-administered medications on file prior to visit.    No Known Allergies     Physical Exam Vitals requested from patient and listed below if patient had equipment and was able to obtain at home for this virtual visit: There were no vitals filed for this visit. Estimated body mass index is 24.37 kg/m as calculated from the following:   Height as of 10/26/22: 5\' 1"  (1.549 m).   Weight  as of this encounter: 129 lb (58.5 kg).  EKG (optional): deferred due to virtual visit  GENERAL: alert, oriented, no acute distress detected, full vision exam deferred due to pandemic and/or virtual encounter  HEENT: atraumatic, conjunttiva clear, no obvious abnormalities on inspection of external nose and ears  NECK: normal movements of the head and neck  LUNGS: on inspection no signs of respiratory distress, breathing rate appears normal, no obvious gross SOB, gasping or wheezing  CV: no obvious cyanosis  MS: moves all visible extremities without noticeable abnormality  PSYCH/NEURO: pleasant and cooperative, no obvious depression or anxiety, speech and thought processing grossly intact, Cognitive function grossly intact  Flowsheet Row Office Visit from [facility]/14/2021 in [facility] [place]  PHQ-9 Total Score 0           02/01/2023    5:44 PM 12/14/2021   11:11 AM 01/13/2021    2:30 PM  Depression screen PHQ 2/9  Decreased Interest 0 0 0  Down, Depressed, Hopeless 0 0 0  PHQ - 2 Score 0 0 0  Altered sleeping  0 0  Tired, decreased energy  0 1  Change in appetite  0 0  Feeling bad or failure about yourself   0 0  Trouble concentrating  0 0  Moving slowly or fidgety/restless  0 0  Suicidal thoughts  0 0  PHQ-9 Score  0 1  Difficult doing work/chores  Not difficult at all        12/14/2021   11:10 AM 10/26/2022    8:27 AM 02/01/2023    5:44 PM  Fall Risk  Falls in the past year? 0 0 0  Was there an injury with Fall? 0 0 0  Fall Risk Category Calculator 0 0 0  Fall Risk Category (Retired) Low    (RETIRED) Patient Fall Risk Level Low fall risk    Patient at Risk for Falls Due to No Fall Risks No Fall Risks   Fall risk Follow up Falls prevention discussed Falls evaluation completed Falls evaluation completed     SUMMARY AND PLAN:  Encounter for  Medicare annual wellness exam   Discussed applicable health maintenance/preventive health measures and  advised and referred or ordered per patient preferences: -gets bone density and mammograms with Dr. Langston Masker (gyn), on prolia, just had her dexa in May of this year per her report -discussed vaccines due, she declined the flu and covid vaccines -discussed hepatitis C screening, she can request with labs if wishes to do Health Maintenance  Topic Date Due   Hepatitis C Screening  Never done   COVID-19 Vaccine (1) 02/17/2024 (Originally 05/27/1961)   INFLUENZA VACCINE  09/11/2024 (Originally 01/13/2023)   MAMMOGRAM  05/15/2023   Medicare Annual Wellness (AWV)  02/01/2024   DTaP/Tdap/Td (4 - Td or Tdap) 02/04/2026   Colonoscopy  07/20/2027   Pneumonia Vaccine 47+ Years old  Completed   DEXA SCAN  Completed   Zoster Vaccines- Shingrix  Completed   HPV VACCINES  Aged [employer] and counseling on the following was provided based on the above review of health and a plan/checklist for the patient, along with additional information discussed, was provided for the patient in the patient instructions :   -Advised and counseled on a healthy lifestyle  -Reviewed patient's current diet. Advised and counseled on a whole foods based healthy diet. A summary of a healthy diet was provided in the Patient Instructions.  -reviewed patient's current physical activity level and discussed exercise guidelines for adults. Discussed ideas for safe exercise at home to assist in meeting exercise guideline recommendations in a safe and healthy way.  -Advise yearly dental visits at minimum and regular eye exams -Advised and counseled on alcohol safe limits  Follow up: see patient instructions     Patient Instructions  I really enjoyed getting to talk with you today! I am available on Tuesdays and Thursdays for virtual visits if you have any questions or concerns, or if I can be of any further assistance.   CHECKLIST FROM ANNUAL WELLNESS VISIT:  -Follow up (please call to schedule if not scheduled after  visit):   -yearly for annual wellness visit with primary care office  Here is a list of your preventive care/health maintenance measures and the plan for each if any are due:  PLAN For any measures below that may be due:   Health Maintenance  Topic Date Due   Hepatitis C Screening  Never done   COVID-19 Vaccine (1) 02/17/2024 (Originally 05/27/1961)   INFLUENZA VACCINE  09/11/2024 (Originally 01/13/2023)   MAMMOGRAM  05/15/2023   Medicare Annual Wellness (AWV)  02/01/2024   DTaP/Tdap/Td (4 - Td or Tdap) 02/04/2026   Colonoscopy  07/20/2027   Pneumonia Vaccine 13+ Years old  Completed   DEXA SCAN  Completed   Zoster Vaccines- Shingrix  Completed   HPV VACCINES  Aged Out    -See a dentist at least yearly  -Get your eyes checked and then per your eye specialist's recommendations  -Other issues addressed today:   -I have included below further information regarding a healthy whole foods based diet, physical activity guidelines for adults, stress management and opportunities for social connections. I hope you find this information useful.   -----------------------------------------------------------------------------------------------------------------------------------------------------------------------------------------------------------------------------------------------------------  NUTRITION: -eat real food: lots of colorful vegetables (half the plate) and fruits -5-7 servings of vegetables and fruits per day (fresh or steamed is best), exp. 2 servings of vegetables with lunch and dinner and 2 servings of fruit per day. Berries and greens such as kale and collards are great choices.  -consume on a regular basis: whole grains (  make sure first ingredient on label contains the word "whole"), fresh fruits, fish, nuts, seeds, healthy oils (such as olive oil, avocado oil, grape seed oil) -may eat small amounts of dairy and lean meat on occasion, but avoid processed meats such as ham,  bacon, lunch meat, etc. -drink water -try to avoid fast food and pre-packaged foods, processed meat -most experts advise limiting sodium to < 2300mg  per day, should limit further is any chronic conditions such as high blood pressure, heart disease, diabetes, etc. The American Heart Association advised that < 1500mg  is is ideal -try to avoid foods that contain any ingredients with names you do not recognize  -try to avoid sugar/sweets (except for the natural sugar that occurs in fresh fruit) -try to avoid sweet drinks -try to avoid white rice, white bread, pasta (unless whole grain), white or yellow potatoes  EXERCISE GUIDELINES FOR ADULTS: -if you wish to increase your physical activity, do so gradually and with the approval of your doctor -STOP and seek medical care immediately if you have any chest pain, chest discomfort or trouble breathing when starting or increasing exercise  -move and stretch your body, legs, feet and arms when sitting for long periods -Physical activity guidelines for optimal health in adults: -least 150 minutes per week of aerobic exercise (can talk, but not sing) once approved by your doctor, 20-30 minutes of sustained activity or two 10 minute episodes of sustained activity every day.  -resistance training at least 2 days per week if approved by your doctor -balance exercises 3+ days per week:   Stand somewhere where you have something sturdy to hold onto if you lose balance.    1) lift up on toes, start with 5x per day and work up to 20x   2) stand and lift on leg straight out to the side so that foot is a few inches of the floor, start with 5x each side and work up to 20x each side   3) stand on one foot, start with 5 seconds each side and work up to 20 seconds on each side  If you need ideas or help with getting more active:  -Silver sneakers https://tools.silversneakers.com  -Walk with a Doc: http://www.duncan-williams.com/  -try to include resistance (weight  lifting/strength building) and balance exercises twice per week: or the following link for ideas: http://castillo-powell.com/  BuyDucts.dk  STRESS MANAGEMENT: -can try meditating, or just sitting quietly with deep breathing while intentionally relaxing all parts of your body for 5 minutes daily -if you need further help with stress, anxiety or depression please follow up with your primary doctor or contact the wonderful folks at [facility]: [phone]  SOCIAL CONNECTIONS: -options in [place] if you wish to engage in more social and exercise related activities:  -Silver sneakers https://tools.silversneakers.com  -Walk with a Doc: http://www.duncan-williams.com/  -Check out the [facility] Active Adults 50+ section on the [place] of [employer] (hiking clubs, book clubs, cards and games, chess, exercise classes, aquatic classes and much more) - see the website for details: https://www.[place].gov/departments/parks-recreation/active-adults50  -YouTube has lots of exercise videos for different ages and abilities as well  -[facility] (a variety of indoor and outdoor inperson activities for adults). [phone]. [address]  -Virtual Online Classes (a variety of topics): see seniorplanet.org or call [phone]  -consider volunteering at a school, hospice center, church, senior center or elsewhere           Terressa Koyanagi, DO

## 2023-02-02 ENCOUNTER — Ambulatory Visit: Payer: Medicare Other | Admitting: Internal Medicine

## 2023-02-13 ENCOUNTER — Other Ambulatory Visit: Payer: Self-pay | Admitting: Internal Medicine

## 2023-02-15 ENCOUNTER — Telehealth: Payer: Self-pay

## 2023-02-15 NOTE — Telephone Encounter (Signed)
Per note from 10/26/2022: "Plan lab in summer will order future labs and then visit fu physical med eval medical "  Contacted pt. And schedule a lab appt on 02/28/2023.  Pt reports she already have a physical with GYN around January, 2024. She wants to know if she can do virtual visit for med eval. Please advise.

## 2023-02-15 NOTE — Telephone Encounter (Signed)
Yes  can do virtual for med check . Please arrange  I did refill this time in interim

## 2023-02-21 NOTE — Telephone Encounter (Signed)
Attempted to reach pt. LVM to call us back.

## 2023-02-23 NOTE — Telephone Encounter (Signed)
Appt scheduled on 03/08/2023.

## 2023-02-28 ENCOUNTER — Other Ambulatory Visit (INDEPENDENT_AMBULATORY_CARE_PROVIDER_SITE_OTHER): Payer: Medicare Other

## 2023-02-28 DIAGNOSIS — E559 Vitamin D deficiency, unspecified: Secondary | ICD-10-CM

## 2023-02-28 DIAGNOSIS — M810 Age-related osteoporosis without current pathological fracture: Secondary | ICD-10-CM | POA: Diagnosis not present

## 2023-02-28 DIAGNOSIS — E785 Hyperlipidemia, unspecified: Secondary | ICD-10-CM

## 2023-02-28 DIAGNOSIS — Z79899 Other long term (current) drug therapy: Secondary | ICD-10-CM

## 2023-02-28 LAB — LIPID PANEL
Cholesterol: 216 mg/dL — ABNORMAL HIGH (ref 0–200)
HDL: 52.6 mg/dL (ref >39.00)
LDL Cholesterol: 142 mg/dL — ABNORMAL HIGH (ref 0–99)
NonHDL: 163.16
Total CHOL/HDL Ratio: 4
Triglycerides: 108 mg/dL (ref 0.0–149.0)
VLDL: 21.6 mg/dL (ref 0.0–40.0)

## 2023-02-28 LAB — CBC WITH DIFFERENTIAL/PLATELET
Basophils Absolute: 0 10*3/uL (ref 0.0–0.1)
Basophils Relative: 0.8 % (ref 0.0–3.0)
Eosinophils Absolute: 0.2 10*3/uL (ref 0.0–0.7)
Eosinophils Relative: 2.6 % (ref 0.0–5.0)
HCT: 39.9 % (ref 36.0–46.0)
Hemoglobin: 13 g/dL (ref 12.0–15.0)
Lymphocytes Relative: 48.7 % — ABNORMAL HIGH (ref 12.0–46.0)
Lymphs Abs: 2.9 10*3/uL (ref 0.7–4.0)
MCHC: 32.7 g/dL (ref 30.0–36.0)
MCV: 88 fl (ref 78.0–100.0)
Monocytes Absolute: 0.5 10*3/uL (ref 0.1–1.0)
Monocytes Relative: 8 % (ref 3.0–12.0)
Neutro Abs: 2.4 10*3/uL (ref 1.4–7.7)
Neutrophils Relative %: 39.9 % — ABNORMAL LOW (ref 43.0–77.0)
Platelets: 274 10*3/uL (ref 150.0–400.0)
RBC: 4.53 Mil/uL (ref 3.87–5.11)
RDW: 13.4 % (ref 11.5–15.5)
WBC: 6 10*3/uL (ref 4.0–10.5)

## 2023-02-28 LAB — VITAMIN D 25 HYDROXY (VIT D DEFICIENCY, FRACTURES): VITD: 44.61 ng/mL (ref 30.00–100.00)

## 2023-02-28 LAB — HEPATIC FUNCTION PANEL
ALT: 15 U/L (ref 0–35)
AST: 15 U/L (ref 0–37)
Albumin: 4 g/dL (ref 3.5–5.2)
Alkaline Phosphatase: 40 U/L (ref 39–117)
Bilirubin, Direct: 0.1 mg/dL (ref 0.0–0.3)
Total Bilirubin: 0.4 mg/dL (ref 0.2–1.2)
Total Protein: 6.4 g/dL (ref 6.0–8.3)

## 2023-02-28 LAB — TSH: TSH: 1.55 u[IU]/mL (ref 0.35–5.50)

## 2023-02-28 LAB — BASIC METABOLIC PANEL
BUN: 20 mg/dL (ref 6–23)
CO2: 24 meq/L (ref 19–32)
Calcium: 8.8 mg/dL (ref 8.4–10.5)
Chloride: 108 meq/L (ref 96–112)
Creatinine, Ser: 0.92 mg/dL (ref 0.40–1.20)
GFR: 64.32 mL/min (ref >60.00)
Glucose, Bld: 89 mg/dL (ref 70–99)
Potassium: 4.1 meq/L (ref 3.5–5.1)
Sodium: 140 meq/L (ref 135–145)

## 2023-02-28 LAB — HEMOGLOBIN A1C: Hgb A1c MFr Bld: 5.6 % (ref 4.6–6.5)

## 2023-02-28 NOTE — Progress Notes (Signed)
Cholesterol improved but still elevated  rest   in range  Will review at upcoming visit this month

## 2023-03-03 LAB — LIPOPROTEIN A (LPA): Lipoprotein (a): 50 nmol/L (ref <75)

## 2023-03-05 NOTE — Progress Notes (Signed)
Lipo a favorable will review at upcoming viist

## 2023-03-08 ENCOUNTER — Encounter: Payer: Self-pay | Admitting: Internal Medicine

## 2023-03-08 ENCOUNTER — Telehealth (INDEPENDENT_AMBULATORY_CARE_PROVIDER_SITE_OTHER): Payer: Medicare Other | Admitting: Internal Medicine

## 2023-03-08 VITALS — Ht 61.0 in | Wt 129.0 lb

## 2023-03-08 DIAGNOSIS — G4700 Insomnia, unspecified: Secondary | ICD-10-CM

## 2023-03-08 DIAGNOSIS — H1032 Unspecified acute conjunctivitis, left eye: Secondary | ICD-10-CM

## 2023-03-08 DIAGNOSIS — E785 Hyperlipidemia, unspecified: Secondary | ICD-10-CM

## 2023-03-08 DIAGNOSIS — Z79899 Other long term (current) drug therapy: Secondary | ICD-10-CM

## 2023-03-08 MED ORDER — POLYMYXIN B-TRIMETHOPRIM 10000-0.1 UNIT/ML-% OP SOLN: 1.0000 [drp] | Freq: Four times a day (QID) | OPHTHALMIC | 0 refills | Status: AC

## 2023-03-08 NOTE — Progress Notes (Signed)
Virtual Visit via Video Note  I connected with Marcia Irwin on 03/08/23 at  4:00 PM EDT by a video enabled telemedicine application and verified that I am speaking with the correct person using two identifiers. Location patient: home Location provider:work office Persons participating in the virtual visit: patient, provider   Patient aware  of the limitations of evaluation and management by telemedicine and  availability of in person appointments. and agreed to proceed.   HPI: Marcia Irwin presents for video visit    Left eye  red  no contact  and not for sure  gg child may had pink eye. Redder and am good ck.   Days 3 some am discharge stuck. No  vision change fever or uri.  Fu labs hase been more attention to exercise and better eating at times . No cv sx  Working again but not long term  irreg sleep with this  still uses Ambien to cycle sleep    ROS: See pertinent positives and negatives per HPI.  Past Medical History:  Diagnosis Date   Allergy    SEASONAL   ANXIETY, SITUATIONAL 04/24/2008   Qualifier: Diagnosis of  By: Fabian Sharp MD, Neta Mends    Arthritis    FINGER   Breast cyst    aspirated followed with imaging   BREAST MASS 01/28/2010   Qualifier: Diagnosis of  By: Abner Greenspan MD, Stacey     Cancer [facility])    SKIN,BASAL CELL   Cough, persistent 06/20/2014   rx for bronchospasm pos pist infectious  dec airway irratilbity optimize flonase and   ppi for now  and fu if needed no hx of asthma nl exam excep dry cough sp    DIPLOPIA, HX OF 04/01/2008   Qualifier: Diagnosis of  By: Fabian Sharp MD, Neta Mends    GERD (gastroesophageal reflux disease) 2006   with esophagitis and gi bleed    Headache(784.0)    episode diplopia eval neg mri work up   Osteopenia    Tubal pregnancy 1993   Unspecified vitamin D deficiency 11/13/2007   Qualifier: Diagnosis of  By: Fabian Sharp MD, Neta Mends    UTI (lower urinary tract infection)     Past Surgical History:  Procedure Laterality Date    BREAST CYST ASPIRATION     BREAST ENHANCEMENT SURGERY     COLONOSCOPY     DILATION AND CURETTAGE OF UTERUS     x 2   PHOTOREFRACTIVE KERATOTOMY Right 2008   Dr. Elmer Picker   TONSILLECTOMY     TRIGGER FINGER RELEASE  05/15/2015   right thumb weingold    TUBAL LIGATION     UPPER GASTROINTESTINAL ENDOSCOPY      Family History  Problem Relation Age of Onset   Cataracts Mother    Colon cancer Father 46   Heart attack Father    Glaucoma Maternal Grandmother    Colon cancer Paternal Grandfather    Breast cancer Other    Colon cancer Other    Diabetes Other    Hyperlipidemia Other    Hypertension Other    Stroke Other    Heart disease Other    Other Other        clots grandmother   Colon polyps Neg Hx    Crohn's disease Neg Hx    Esophageal cancer Neg Hx    Stomach cancer Neg Hx    Ulcerative colitis Neg Hx    Uterine cancer Neg Hx    Rectal cancer Neg Hx  Social History   Tobacco Use   Smoking status: Never    Passive exposure: Past ("YOUNGER")   Smokeless tobacco: Never   Tobacco comments:    rare  Vaping Use   Vaping status: Never Used  Substance Use Topics   Alcohol use: Yes    Alcohol/week: 3.0 standard drinks of alcohol    Types: 3 Glasses of wine per week    Comment: 2 PER WEEK   Drug use: No      Current Outpatient Medications:    Ergocalciferol 10 MCG (400 UNIT) TABS, daily., Disp: , Rfl:    Multiple Vitamin (MULTIVITAMIN ADULT PO), Take by mouth., Disp: , Rfl:    PROLIA 60 MG/ML SOSY injection, Inject into the skin., Disp: , Rfl:    trimethoprim-polymyxin b (POLYTRIM) ophthalmic solution, Place 1 drop into the left eye every 6 (six) hours., Disp: 10 mL, Rfl: 0   zolpidem (AMBIEN) 5 MG tablet, TAKE 1 TABLET BY MOUTH EVERY DAY AT BEDTIME AS NEEDED FOR INSOMNIA, Disp: 30 tablet, Rfl: 2  EXAM: BP Readings from Last 3 Encounters:  01/03/23 118/72  07/19/22 116/72  05/12/22 120/80    VITALS per patient if applicable:  GENERAL: alert, oriented,  appears well and in no acute distress  HEENT: atraumatic, conjunttiva left  1-2+ pink red no edema , no obvious abnormalities on inspection of external nose and ears wearing glasses   NECK: normal movements of the head and neck  LUNGS: on inspection no signs of respiratory distress, breathing rate appears normal, no obvious gross SOB, gasping or wheezing  CV: no obvious cyanosis  MS: moves all visible extremities without noticeable abnormality  PSYCH/NEURO: pleasant and cooperative, no obvious depression or anxiety, speech and thought processing grossly intact Lab Results  Component Value Date   WBC 6.0 02/28/2023   HGB 13.0 02/28/2023   HCT 39.9 02/28/2023   PLT 274.0 02/28/2023   GLUCOSE 89 02/28/2023   CHOL 216 (H) 02/28/2023   TRIG 108.0 02/28/2023   HDL 52.60 02/28/2023   LDLDIRECT 128.0 08/01/2018   LDLCALC 142 (H) 02/28/2023   ALT 15 02/28/2023   AST 15 02/28/2023   NA 140 02/28/2023   K 4.1 02/28/2023   CL 108 02/28/2023   CREATININE 0.92 02/28/2023   BUN 20 02/28/2023   CO2 24 02/28/2023   TSH 1.55 02/28/2023   HGBA1C 5.6 02/28/2023    ASSESSMENT AND PLAN:  Discussed the following assessment and plan:    ICD-10-CM   1. Acute conjunctivitis of left eye, unspecified acute conjunctivitis type  H10.32    viral vs bacterial some exposre    2. Hyperlipidemia, unspecified hyperlipidemia type  E78.5    improved wit lsi  favorable lipo a and ct score 0 la in 2023    3. Medication management  Z79.899     4. Insomnia, unspecified type  G47.00    work schedule related  ok to continue ambien     Empiric rx  compresses and add  drops   of viral will runits course.  HLD improved on lsi  continue   . As planned  tc went from 252 to 216 ldl 172- 142 Counseled.   Expectant management and discussion of plan and treatment with opportunity to ask questions and all were answered. The patient agreed with the plan and demonstrated an understanding of the instructions.    Advised to call back or seek an in-person evaluation if worsening  or having  further concerns  in interim.  Return in about 6 months (around 09/05/2023).    Berniece Andreas, MD

## 2023-05-20 ENCOUNTER — Other Ambulatory Visit: Payer: Self-pay | Admitting: Internal Medicine

## 2023-06-29 ENCOUNTER — Ambulatory Visit: Payer: Medicare Other | Admitting: Orthopaedic Surgery

## 2023-06-29 DIAGNOSIS — M7061 Trochanteric bursitis, right hip: Secondary | ICD-10-CM

## 2023-06-29 DIAGNOSIS — M25551 Pain in right hip: Secondary | ICD-10-CM

## 2023-06-29 MED ORDER — LIDOCAINE HCL 1 % IJ SOLN
3.0000 mL | INTRAMUSCULAR | Status: AC | PRN
Start: 2023-06-29 — End: 2023-06-29
  Administered 2023-06-29: 3 mL

## 2023-06-29 MED ORDER — METHYLPREDNISOLONE ACETATE 40 MG/ML IJ SUSP
40.0000 mg | INTRAMUSCULAR | Status: AC | PRN
Start: 2023-06-29 — End: 2023-06-29
  Administered 2023-06-29: 40 mg via INTRA_ARTICULAR

## 2023-06-29 NOTE — Progress Notes (Signed)
 The patient is an active 68 year old female who is referred from Dr. Alyne Babinski to evaluate and treat chronic right hip pain.  She says some of her pain is in the groin but she does point to the side of her right hip and her low back to the right side as a source of her pain.  There is no radicular component.  She is active and thin.  She is not a diabetic.  She is not obese.  She denies any specific injury.  It is not detrimentally affecting her mobility or quality of life.  I was able to review her medications and past medical history within epic.  On examination her right hip moves smoothly and fluidly with no blocks to rotation and no pain in the groin at all.  Her left hip also moves smoothly.  When I have her lay in a supine position her leg lengths are equal.  When she rolls to her side with her right hip up there is some pain to palpation over the trochanteric area and the proximal IT band.  There is a little bit of low back pain to the right side.  There is no specific evidence of sciatica.  She has a negative straight leg raise.  X-rays from July 2024 showing her pelvis and right hip show normal-appearing hips bilaterally.  The hip joint space is well-maintained.  There are no other irregular features that I can see on these plain films.  I did show her stretching exercises for her trochanteric area and her low back.  I have recommended Voltaren gel twice daily over this area and I did recommend a steroid injection over the point of maximal tenderness of the trochanteric area and she agreed to this on the right side and tolerated it well.  All questions and concerns were answered and addressed.  Will see her back in a month to see how she is doing overall.     Procedure Note  Patient: Marcia Irwin             Date of Birth: 1955/06/19           MRN: [id number]             Visit Date: 06/29/2023  Procedures: Visit Diagnoses:  1. Pain of right hip   2. Trochanteric bursitis, right hip      Large Joint Inj: R greater trochanter on 06/29/2023 8:41 AM Indications: pain and diagnostic evaluation Details: 22 G 1.5 in needle, lateral approach  Arthrogram: No  Medications: 3 mL lidocaine  1 %; 40 mg methylPREDNISolone  acetate 40 MG/ML Outcome: tolerated well, no immediate complications Procedure, treatment alternatives, risks and benefits explained, specific risks discussed. Consent was given by the patient. Immediately prior to procedure a time out was called to verify the correct patient, procedure, equipment, support staff and site/side marked as required. Patient was prepped and draped in the usual sterile fashion.

## 2023-07-27 ENCOUNTER — Ambulatory Visit: Payer: Medicare Other | Admitting: Orthopaedic Surgery

## 2023-08-19 ENCOUNTER — Other Ambulatory Visit: Payer: Self-pay | Admitting: Family

## 2023-08-24 ENCOUNTER — Other Ambulatory Visit: Payer: Self-pay | Admitting: Internal Medicine

## 2023-08-24 MED ORDER — ZOLPIDEM TARTRATE 5 MG PO TABS: ORAL_TABLET | ORAL | 2 refills | Status: DC

## 2023-08-24 NOTE — Telephone Encounter (Signed)
 Copied from CRM [id number]. Topic: Clinical - Medication Refill >> Aug 24, 2023 12:11 PM Orinda Kenner C wrote: Most Recent Primary Care Visit:  Provider: Madelin Headings  Department: [facility]  Visit Type: MYCHART VIDEO VISIT  Date: 03/08/2023  Medication: zolpidem (AMBIEN) 5 MG tablet    Has the patient contacted their pharmacy? Yes (Agent: If no, request that the patient contact the pharmacy for the refill. If patient does not wish to contact the pharmacy document the reason why and proceed with request.) (Agent: If yes, when and what did the pharmacy advise?)  Is this the correct pharmacy for this prescription? Yes If no, delete pharmacy and type the correct one.  This is the patient's preferred pharmacy:  [id number] [place], [place] - [address] [place] [address] Phone: [phone] Fax: [phone]  Has the prescription been filled recently? No  Is the patient out of the medication? Yes and is asking for refills until office visit.   Has the patient been seen for an appointment in the last year OR does the patient have an upcoming appointment? Yes, 09/15/23 at 9 am.   Can we respond through MyChart? No, please [phone]  Agent: Please be advised that Rx refills may take up to 3 business days. We ask that you follow-up with your pharmacy.

## 2023-09-20 ENCOUNTER — Ambulatory Visit: Admitting: Internal Medicine

## 2023-10-10 NOTE — Progress Notes (Unsigned)
 No chief complaint on file.   HPI: Marcia Irwin 68 y.o. come in for Chronic disease management  Med eval  ROS: See pertinent positives and negatives per HPI.  Past Medical History:  Diagnosis Date   Allergy    SEASONAL   ANXIETY, SITUATIONAL 04/24/2008   Qualifier: Diagnosis of  By: Ethel Henry MD, Joaquim Muir    Arthritis    FINGER   Breast cyst    aspirated followed with imaging   BREAST MASS 01/28/2010   Qualifier: Diagnosis of  By: Rodrick Clapper MD, Stacey     Cancer [facility])    SKIN,BASAL CELL   Cough, persistent 06/20/2014   rx for bronchospasm pos pist infectious  dec airway irratilbity optimize flonase  and   ppi for now  and fu if needed no hx of asthma nl exam excep dry cough sp    DIPLOPIA, HX OF 04/01/2008   Qualifier: Diagnosis of  By: Ethel Henry MD, Joaquim Muir    GERD (gastroesophageal reflux disease) 2006   with esophagitis and gi bleed    Headache(784.0)    episode diplopia eval neg mri work up   Osteopenia    Tubal pregnancy 1993   Unspecified vitamin D  deficiency 11/13/2007   Qualifier: Diagnosis of  By: Ethel Henry MD, Joaquim Muir    UTI (lower urinary tract infection)     Family History  Problem Relation Age of Onset   Cataracts Mother    Colon cancer Father 25   Heart attack Father    Glaucoma Maternal Grandmother    Colon cancer Paternal Grandfather    Breast cancer Other    Colon cancer Other    Diabetes Other    Hyperlipidemia Other    Hypertension Other    Stroke Other    Heart disease Other    Other Other        clots grandmother   Colon polyps Neg Hx    Crohn's disease Neg Hx    Esophageal cancer Neg Hx    Stomach cancer Neg Hx    Ulcerative colitis Neg Hx    Uterine cancer Neg Hx    Rectal cancer Neg Hx     Social History   Socioeconomic History   Marital status: Married    Spouse name: Not on file   Number of children: Not on file   Years of education: Not on file   Highest education level: Not on file  Occupational History   Not on file   Tobacco Use   Smoking status: Never    Passive exposure: Past ("YOUNGER")   Smokeless tobacco: Never   Tobacco comments:    rare  Vaping Use   Vaping status: Never Used  Substance and Sexual Activity   Alcohol use: Yes    Alcohol/week: 3.0 standard drinks of alcohol    Types: 3 Glasses of wine per week    Comment: 2 PER WEEK   Drug use: No   Sexual activity: Not on file  Other Topics Concern   Not on file  Social History Narrative   Married   Regular exercise- no    HH of 2 except college  Age.   Pets cat and dog   Sleep   60 hours week  Travels.    Neg ets .   2-3 etoh per week.   Social Drivers of Corporate investment banker Strain: Not on file  Food Insecurity: Not on file  Transportation Needs: Not on file  Physical Activity: Not on  file  Stress: Not on file  Social Connections: Not on file    Outpatient Medications Prior to Visit  Medication Sig Dispense Refill   Ergocalciferol  10 MCG (400 UNIT) TABS daily.     Multiple Vitamin (MULTIVITAMIN ADULT PO) Take by mouth.     PROLIA 60 MG/ML SOSY injection Inject into the skin.     trimethoprim -polymyxin b  (POLYTRIM ) ophthalmic solution Place 1 drop into the left eye every 6 (six) hours. 10 mL 0   zolpidem  (AMBIEN ) 5 MG tablet TAKE 1 TABLET BY MOUTH AT BEDTIME AS NEEDED FOR INSOMNIA 30 tablet 2   No facility-administered medications prior to visit.     EXAM:  There were no vitals taken for this visit.  There is no height or weight on file to calculate BMI.  GENERAL: vitals reviewed and listed above, alert, oriented, appears well hydrated and in no acute distress HEENT: atraumatic, conjunctiva  clear, no obvious abnormalities on inspection of external nose and ears OP : no lesion edema or exudate  NECK: no obvious masses on inspection palpation  LUNGS: clear to auscultation bilaterally, no wheezes, rales or rhonchi, good air movement CV: HRRR, no clubbing cyanosis or  peripheral edema nl cap refill  MS:  moves all extremities without noticeable focal  abnormality PSYCH: pleasant and cooperative, no obvious depression or anxiety Lab Results  Component Value Date   WBC 6.0 02/28/2023   HGB 13.0 02/28/2023   HCT 39.9 02/28/2023   PLT 274.0 02/28/2023   GLUCOSE 89 02/28/2023   CHOL 216 (H) 02/28/2023   TRIG 108.0 02/28/2023   HDL 52.60 02/28/2023   LDLDIRECT 128.0 08/01/2018   LDLCALC 142 (H) 02/28/2023   ALT 15 02/28/2023   AST 15 02/28/2023   NA 140 02/28/2023   K 4.1 02/28/2023   CL 108 02/28/2023   CREATININE 0.92 02/28/2023   BUN 20 02/28/2023   CO2 24 02/28/2023   TSH 1.55 02/28/2023   HGBA1C 5.6 02/28/2023   BP Readings from Last 3 Encounters:  01/03/23 118/72  07/19/22 116/72  05/12/22 120/80    ASSESSMENT AND PLAN:  Discussed the following assessment and plan:  Medication management  Disturbance in sleep behavior  Insomnia, unspecified type  -Patient advised to return or notify health care team  if  new concerns arise.  There are no Patient Instructions on file for this visit.   Darrius Montano K. Chane Cowden M.D.

## 2023-10-11 ENCOUNTER — Ambulatory Visit (INDEPENDENT_AMBULATORY_CARE_PROVIDER_SITE_OTHER): Admitting: Internal Medicine

## 2023-10-11 ENCOUNTER — Encounter: Payer: Self-pay | Admitting: Internal Medicine

## 2023-10-11 VITALS — BP 122/86 | HR 93 | Temp 97.9°F | Ht 61.0 in | Wt 113.0 lb

## 2023-10-11 DIAGNOSIS — Z79899 Other long term (current) drug therapy: Secondary | ICD-10-CM

## 2023-10-11 DIAGNOSIS — G479 Sleep disorder, unspecified: Secondary | ICD-10-CM | POA: Diagnosis not present

## 2023-10-11 DIAGNOSIS — Z23 Encounter for immunization: Secondary | ICD-10-CM

## 2023-10-11 DIAGNOSIS — G4700 Insomnia, unspecified: Secondary | ICD-10-CM

## 2023-10-11 MED ORDER — ZOLPIDEM TARTRATE 5 MG PO TABS: ORAL_TABLET | ORAL | 1 refills | Status: DC

## 2023-10-11 NOTE — Patient Instructions (Signed)
 Good to see you today  Refill ambien   as needed. Lipids last check was better  Continue lifestyle intervention healthy eating and exercise .   Prevnar 20 today.

## 2023-10-12 DIAGNOSIS — Z23 Encounter for immunization: Secondary | ICD-10-CM | POA: Diagnosis not present

## 2023-10-12 NOTE — Addendum Note (Signed)
 Addended by: Courtez Twaddle on: 10/12/2023 08:13 AM   Modules accepted: Orders

## 2024-03-08 ENCOUNTER — Ambulatory Visit: Admitting: Family Medicine

## 2024-04-12 ENCOUNTER — Other Ambulatory Visit: Payer: Self-pay | Admitting: Internal Medicine

## 2024-04-16 ENCOUNTER — Encounter: Payer: Self-pay | Admitting: Radiology

## 2024-04-23 ENCOUNTER — Ambulatory Visit (INDEPENDENT_AMBULATORY_CARE_PROVIDER_SITE_OTHER): Admitting: Family Medicine

## 2024-04-23 ENCOUNTER — Ambulatory Visit: Payer: Self-pay

## 2024-04-23 ENCOUNTER — Encounter: Payer: Self-pay | Admitting: Family Medicine

## 2024-04-23 VITALS — BP 110/72 | HR 104 | Temp 98.3°F | Ht 60.0 in | Wt 120.0 lb

## 2024-04-23 DIAGNOSIS — J0140 Acute pansinusitis, unspecified: Secondary | ICD-10-CM | POA: Diagnosis not present

## 2024-04-23 DIAGNOSIS — R051 Acute cough: Secondary | ICD-10-CM | POA: Diagnosis not present

## 2024-04-23 MED ORDER — AMOXICILLIN-POT CLAVULANATE 500-125 MG PO TABS: 1.0000 | ORAL_TABLET | Freq: Two times a day (BID) | ORAL | 0 refills | Status: AC

## 2024-04-23 NOTE — Progress Notes (Signed)
 Established Patient Office Visit   Subjective  Patient ID: Marcia Irwin, female    DOB: 1956-01-15  Age: 68 y.o. MRN: [id number]  Chief Complaint  Patient presents with   Nasal Congestion    Cough about to start to get bad she says. Has been dealing with this for about a week and has not done any testing. Trying tylenol , advil, nyquil, mucinex . Nothing is helping.     Pt is a 68 yo female followed by Dr. Charlett and seen for acute concern.  Pt with cough, dizziness, ear pressure, sore throat, facial pain/pressure, and dental pressure x 5-6 d.  Feels the dizziness with leaning down.  Endorses fever, Tmax 101F, mild nausea, and loose stools last wk.  Tried tylenol  and advil.  Sick contacts include young nieces and nephews.  Doesn't want sx to become worse.    Patient Active Problem List   Diagnosis Date Noted   Arthritis of carpometacarpal [facility]) joint of right thumb 06/22/2018   Carpal tunnel syndrome of right wrist 06/22/2018   Trigger thumb of right hand 06/05/2015   Memory difficulties 11/05/2012   Multiple joint pain 11/05/2012   Chronic rhinitis 08/10/2011   Medication management 08/10/2011   Sinusitis, chronic 04/29/2009   Disturbance in sleep behavior 01/13/2009   Headache 04/01/2008   Alopecia 11/13/2007   Insomnia 03/07/2007   GERD 02/16/2007   History of colonic polyps 08/21/2003   Past Medical History:  Diagnosis Date   Allergy    SEASONAL   ANXIETY, SITUATIONAL 04/24/2008   Qualifier: Diagnosis of  By: Charlett MD, Apolinar POUR    Arthritis    FINGER   Breast cyst    aspirated followed with imaging   BREAST MASS 01/28/2010   Qualifier: Diagnosis of  By: Domenica MD, Stacey     Cancer [facility])    SKIN,BASAL CELL   Cough, persistent 06/20/2014   rx for bronchospasm pos pist infectious  dec airway irratilbity optimize flonase  and   ppi for now  and fu if needed no hx of asthma nl exam excep dry cough sp    DIPLOPIA, HX OF 04/01/2008   Qualifier: Diagnosis of  By:  Charlett MD, Apolinar POUR    GERD (gastroesophageal reflux disease) 2006   with esophagitis and gi bleed    Headache(784.0)    episode diplopia eval neg mri work up   Osteopenia    Tubal pregnancy 1993   Unspecified vitamin D  deficiency 11/13/2007   Qualifier: Diagnosis of  By: Charlett MD, Apolinar POUR    UTI (lower urinary tract infection)    Past Surgical History:  Procedure Laterality Date   BREAST CYST ASPIRATION     BREAST ENHANCEMENT SURGERY     COLONOSCOPY     DILATION AND CURETTAGE OF UTERUS     x 2   PHOTOREFRACTIVE KERATOTOMY Right 2008   Dr. Cleatus   TONSILLECTOMY     TRIGGER FINGER RELEASE  05/15/2015   right thumb weingold    TUBAL LIGATION     UPPER GASTROINTESTINAL ENDOSCOPY     Social History   Tobacco Use   Smoking status: Never    Passive exposure: Past (YOUNGER)   Smokeless tobacco: Never   Tobacco comments:    rare  Vaping Use   Vaping status: Never Used  Substance Use Topics   Alcohol use: Yes    Alcohol/week: 3.0 standard drinks of alcohol    Types: 3 Glasses of wine per week    Comment: 2 PER  WEEK   Drug use: No   Family History  Problem Relation Age of Onset   Cataracts Mother    Colon cancer Father 55   Heart attack Father    Glaucoma Maternal Grandmother    Colon cancer Paternal Grandfather    Breast cancer Other    Colon cancer Other    Diabetes Other    Hyperlipidemia Other    Hypertension Other    Stroke Other    Heart disease Other    Other Other        clots grandmother   Colon polyps Neg Hx    Crohn's disease Neg Hx    Esophageal cancer Neg Hx    Stomach cancer Neg Hx    Ulcerative colitis Neg Hx    Uterine cancer Neg Hx    Rectal cancer Neg Hx    No Known Allergies  ROS Negative unless stated above    Objective:     BP 110/72   Pulse (!) 104   Temp 98.3 F (36.8 C) (Oral)   Ht 5' (1.524 m)   Wt 120 lb (54.4 kg)   SpO2 95%   BMI 23.44 kg/m  BP Readings from Last 3 Encounters:  04/23/24 110/72  10/11/23 122/86   01/03/23 118/72   Wt Readings from Last 3 Encounters:  04/23/24 120 lb (54.4 kg)  10/11/23 113 lb (51.3 kg)  03/08/23 129 lb (58.5 kg)      Physical Exam Constitutional:      General: She is not in acute distress.    Appearance: Normal appearance.  HENT:     Head: Normocephalic and atraumatic.     Right Ear: Hearing, ear canal and external ear normal.     Left Ear: Hearing, ear canal and external ear normal.     Ears:     Comments: B/l TMs full.  No erythema or suppurative fluid.    Nose: Rhinorrhea present.     Left Turbinates: Swollen.     Right Sinus: Maxillary sinus tenderness and frontal sinus tenderness present.     Left Sinus: Maxillary sinus tenderness and frontal sinus tenderness present.     Mouth/Throat:     Mouth: Mucous membranes are moist.     Pharynx: Posterior oropharyngeal erythema and postnasal drip present. No pharyngeal swelling or oropharyngeal exudate.  Cardiovascular:     Rate and Rhythm: Normal rate and regular rhythm.     Heart sounds: Normal heart sounds. No murmur heard.    No gallop.  Pulmonary:     Effort: Pulmonary effort is normal. No respiratory distress.     Breath sounds: Normal breath sounds. No wheezing, rhonchi or rales.     Comments: Occasional cough. Skin:    General: Skin is warm and dry.  Neurological:     Mental Status: She is alert and oriented to person, place, and time.  Psychiatric:        Behavior: Behavior is cooperative.        02/01/2023    5:44 PM 12/14/2021   11:11 AM 01/13/2021    2:30 PM  Depression screen PHQ 2/9  Decreased Interest 0 0 0  Down, Depressed, Hopeless 0 0 0  PHQ - 2 Score 0 0 0  Altered sleeping  0 0  Tired, decreased energy  0 1  Change in appetite  0 0  Feeling bad or failure about yourself   0 0  Trouble concentrating  0 0  Moving slowly or fidgety/restless  0  0  Suicidal thoughts  0 0  PHQ-9 Score  0  1   Difficult doing work/chores  Not difficult at all      Data saved with a previous  flowsheet row definition       No data to display           No results found for any visits on 04/23/24.    Assessment & Plan:   Acute pansinusitis, recurrence not specified -     Amoxicillin -Pot Clavulanate; Take 1 tablet by mouth in the morning and at bedtime for 7 days.  Dispense: 14 tablet; Refill: 0  Acute cough  Viral URI symptoms now acute sinusitis.  Start ABX.  No history of allergies to antibiotics.  Supportive care including Flonase  or nasal saline rinse, Tylenol , ibuprofen for fever, pain/discomfort, gargling with warm salt water or Chloraseptic spray, rest, hydration.  Follow-up PCP as needed.  Return if symptoms worsen or fail to improve.   Clotilda JONELLE Single, MD

## 2024-04-23 NOTE — Telephone Encounter (Signed)
 Offered earlier appointment times but patient wanted the later appointment time at 3:30pm  FYI Only or Action Required?: FYI only for provider: appointment scheduled on 04/23/2024 at 3:30pm with Dr Clotilda Single.  Patient was last seen in primary care on 10/11/2023 by Panosh, Apolinar POUR, MD.  Called Nurse Triage reporting Cough.  Symptoms began 5-6 days ago.  Interventions attempted: OTC medications: mucinex , tylenol , nyquil and Rest, hydration, or home remedies.  Symptoms are: gradually worsening.  Triage Disposition: See Physician Within 24 Hours  Patient/caregiver understands and will follow disposition?: Yes          Copied from CRM [id number]. Topic: Clinical - Red Word Triage >> Apr 23, 2024  8:54 AM Avram MATSU wrote: Red Word that prompted transfer to Nurse Triage: green mucus, throat/under eye hurts, chest heaviness and dizzy Reason for Disposition  [1] Continuous (nonstop) coughing interferes with work or school AND [2] no improvement using cough treatment per Care Advice  Answer Assessment - Initial Assessment Questions Patient is advised to call us  back if anything changes or with any further questions/concerns. Patient is advised that if anything worsens to go to the Emergency Room. Patient verbalized understanding.   1. ONSET: When did the cough begin?      5-6 days ago 2. SEVERITY: How bad is the cough today?      continuous 3. SPUTUM: Describe the color of your sputum (e.g., none, dry cough; clear, white, yellow, green)     green 4. HEMOPTYSIS: Are you coughing up any blood? If Yes, ask: How much? (e.g., flecks, streaks, tablespoons, etc.)     no 5. DIFFICULTY BREATHING: Are you having difficulty breathing? If Yes, ask: How bad is it? (e.g., mild, moderate, severe)      denies 6. FEVER: Do you have a fever? If Yes, ask: What is your temperature, how was it measured, and when did it start?     Yes 101 yesterday 7. CARDIAC HISTORY: Do you  have any history of heart disease? (e.g., heart attack, congestive heart failure)      denies 8. LUNG HISTORY: Do you have any history of lung disease?  (e.g., pulmonary embolus, asthma, emphysema)     denies 9. PE RISK FACTORS: Do you have a history of blood clots? (or: recent major surgery, recent prolonged travel, bedridden)     denies 10. OTHER SYMPTOMS: Do you have any other symptoms? (e.g., runny nose, wheezing, chest pain)       Sore throat, bilateral earaches, congestion, blowing nose--green mucous  Protocols used: Cough - Acute Productive-A-AH

## 2024-06-28 ENCOUNTER — Ambulatory Visit: Admitting: Family Medicine

## 2024-07-12 ENCOUNTER — Other Ambulatory Visit: Payer: Self-pay | Admitting: Family

## 2024-07-12 NOTE — Telephone Encounter (Signed)
 Attempted to reach pt regarding to a follow up appt on med check. Left a voicemail to call us  back.

## 2024-08-15 ENCOUNTER — Ambulatory Visit
